# Patient Record
Sex: Male | Born: 1980 | Race: White | Hispanic: No | Marital: Married | Smoking: Former smoker
Health system: Southern US, Community
[De-identification: ages and names within clinical notes are randomized; demographics above are authoritative.]

## PROBLEM LIST (undated history)

## (undated) DIAGNOSIS — I10 Essential (primary) hypertension: Secondary | ICD-10-CM

## (undated) DIAGNOSIS — H9192 Unspecified hearing loss, left ear: Secondary | ICD-10-CM

## (undated) DIAGNOSIS — G43909 Migraine, unspecified, not intractable, without status migrainosus: Secondary | ICD-10-CM

## (undated) DIAGNOSIS — N200 Calculus of kidney: Secondary | ICD-10-CM

## (undated) HISTORY — PX: CLEFT PALATE REPAIR: SUR1165

## (undated) HISTORY — PX: VASECTOMY: SHX75

## (undated) HISTORY — DX: Migraine, unspecified, not intractable, without status migrainosus: G43.909

## (undated) HISTORY — DX: Calculus of kidney: N20.0

## (undated) HISTORY — PX: FRACTURE SURGERY: SHX138

## (undated) HISTORY — DX: Unspecified hearing loss, left ear: H91.92

---
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
## 2006-01-04 ENCOUNTER — Encounter: Admission: RE | Admit: 2006-01-04 | Discharge: 2006-01-04 | Payer: Self-pay | Admitting: Occupational Medicine

## 2008-04-04 ENCOUNTER — Encounter: Admission: RE | Admit: 2008-04-04 | Discharge: 2008-04-04 | Payer: Self-pay | Admitting: Occupational Medicine

## 2008-10-04 ENCOUNTER — Emergency Department: Payer: Self-pay | Admitting: Emergency Medicine

## 2010-08-06 ENCOUNTER — Emergency Department (HOSPITAL_COMMUNITY)
Admission: EM | Admit: 2010-08-06 | Discharge: 2010-08-06 | Disposition: A | Discharge status: Home / Self Care | Payer: 59 | Attending: Emergency Medicine | Admitting: Emergency Medicine

## 2010-08-06 ENCOUNTER — Emergency Department (HOSPITAL_COMMUNITY): Payer: 59

## 2010-08-06 DIAGNOSIS — N201 Calculus of ureter: Secondary | ICD-10-CM | POA: Insufficient documentation

## 2010-08-06 LAB — URINALYSIS, ROUTINE W REFLEX MICROSCOPIC
Bilirubin Urine: NEGATIVE
Leukocytes, UA: NEGATIVE
Nitrite: NEGATIVE
Specific Gravity, Urine: 1.01 (ref 1.005–1.030)
Urobilinogen, UA: 0.2 mg/dL (ref 0.0–1.0)
pH: 7 (ref 5.0–8.0)

## 2010-08-06 LAB — URINE MICROSCOPIC-ADD ON

## 2011-06-10 ENCOUNTER — Telehealth: Payer: Self-pay | Admitting: Family Medicine

## 2011-06-10 NOTE — Telephone Encounter (Signed)
Okay to do.  Thanks!

## 2011-06-10 NOTE — Telephone Encounter (Signed)
Pt's wife is calling to set her husband up to establish with you. She told me that someone called her and told her it would be ok if her and her husband est with you. Her dad is a current patient of yours Mrs. Ronnie Alexander ([phone]) and she said that [name] had discussed this with you. Just want to make sure this is ok.

## 2011-06-26 ENCOUNTER — Encounter: Payer: Self-pay | Admitting: Family Medicine

## 2011-06-26 ENCOUNTER — Ambulatory Visit (INDEPENDENT_AMBULATORY_CARE_PROVIDER_SITE_OTHER): Payer: 59 | Admitting: Family Medicine

## 2011-06-26 DIAGNOSIS — G43109 Migraine with aura, not intractable, without status migrainosus: Secondary | ICD-10-CM

## 2011-06-26 DIAGNOSIS — I8390 Asymptomatic varicose veins of unspecified lower extremity: Secondary | ICD-10-CM

## 2011-06-26 DIAGNOSIS — Q359 Cleft palate, unspecified: Secondary | ICD-10-CM

## 2011-06-26 MED ORDER — CYCLOBENZAPRINE HCL 10 MG PO TABS: 5.0000 mg | ORAL_TABLET | Freq: Three times a day (TID) | ORAL | Status: DC | PRN

## 2011-06-26 NOTE — Progress Notes (Signed)
Migraines, throbbing pain with preceding aura.  Photophobia.  Several a month.  Lasts up to a day.  Unclear FH.  Caffeine and sleep help the HA.  Since early 20s.    Leg pain, taking tylenol or Aspirin.  He has some mild varicosities on the posterior L thigh.  Asking about options.    PMH and SH reviewed  ROS: See HPI, otherwise noncontributory.  Meds, vitals, and allergies reviewed.   GEN: nad, alert and oriented HEENT: mucous membranes moist, L TM with chronic changes, cleft palate repair noted NECK: supple w/o LA CV: rrr.  PULM: ctab, no inc wob ABD: soft, +bs EXT: no edema but  mild varicosities on the posterior L thigh SKIN: no acute rash CN 2-12 wnl B, S/S/DTR wnl x4

## 2011-06-26 NOTE — Patient Instructions (Signed)
You can try an ace wrap on your leg.  See if that helps.  Take flexeril as needed at the beginning of a headache, sedation caution.  Gradually cut back on caffeine.

## 2011-06-28 DIAGNOSIS — I8390 Asymptomatic varicose veins of unspecified lower extremity: Secondary | ICD-10-CM | POA: Insufficient documentation

## 2011-06-28 DIAGNOSIS — Q359 Cleft palate, unspecified: Secondary | ICD-10-CM | POA: Insufficient documentation

## 2011-06-28 DIAGNOSIS — G43109 Migraine with aura, not intractable, without status migrainosus: Secondary | ICD-10-CM | POA: Insufficient documentation

## 2011-06-28 NOTE — Assessment & Plan Note (Signed)
He could use an ACE wrap on this, since they aren't in the calf and a stocking would be cumbersome.  Reassured o/w. F/u prn.

## 2011-06-28 NOTE — Assessment & Plan Note (Signed)
D/w pt about path/phys, triggers, chronic nature.  Taper caffeine, inc exercise and use flexeril with onset of HA, he'll track his frequency. If more frequent, will likely need proph meds.  D/w pt, he understood.  >30 min spent with face to face with patient, >50% counseling and/or coordinating care.

## 2011-12-03 ENCOUNTER — Ambulatory Visit (INDEPENDENT_AMBULATORY_CARE_PROVIDER_SITE_OTHER): Payer: 59 | Admitting: Family Medicine

## 2011-12-03 ENCOUNTER — Encounter: Payer: Self-pay | Admitting: Family Medicine

## 2011-12-03 VITALS — BP 126/90 | HR 84 | Temp 97.8°F | Wt 232.0 lb

## 2011-12-03 DIAGNOSIS — F411 Generalized anxiety disorder: Secondary | ICD-10-CM

## 2011-12-03 DIAGNOSIS — J310 Chronic rhinitis: Secondary | ICD-10-CM

## 2011-12-03 DIAGNOSIS — F419 Anxiety disorder, unspecified: Secondary | ICD-10-CM

## 2011-12-03 MED ORDER — FLUTICASONE PROPIONATE 50 MCG/ACT NA SUSP: 2.0000 | Freq: Every day | NASAL | Status: DC

## 2011-12-03 MED ORDER — AZITHROMYCIN 250 MG PO TABS: ORAL_TABLET | ORAL | Status: DC

## 2011-12-03 NOTE — Patient Instructions (Signed)
Drink plenty of fluids and use the nasal irrigation.  Use flonase and start the antibiotics if not improving.  Talk with your wife about counseling and let me know next week (call the triage line).  Take care.  Let us know if you have other concerns.   Schedule a physical for the spring of 2014

## 2011-12-03 NOTE — Progress Notes (Signed)
Cold sx started about 3-4 days ago.  "I feel like crap."  H/o spring time allergies, mild.  Fall allergies are much worse usually.  No FCV.  HA and sinus pressure.  Congested.    Still with 1-2 migraines in a month.  Still on caffeine.  He stopped smoking.    Anxiety and marital stress. Discussed. No SI/HI.  He has inc stress on the weekend when he has more time at home.  47 month old child. He and his wife don't always agree on parenting styles.  He gets anxious about this.  They have argued, but not been in physical confrontation.  He's asking about options.   He'd like to schedule a physical at some point.   Meds, vitals, and allergies reviewed.   ROS: See HPI.  Otherwise, noncontributory.  GEN: nad, alert and oriented HEENT: mucous membranes moist, tm w/o erythema but chronic changes noted, nasal exam w/o erythema, clear discharge noted,  OP with cobblestoning- nasal and OP with postsurgical changes noted NECK: supple w/o LA CV: rrr.   PULM: ctab, no inc wob EXT: no edema SKIN: no acute rash Affect wnl, speech wnl

## 2011-12-04 DIAGNOSIS — J310 Chronic rhinitis: Secondary | ICD-10-CM | POA: Insufficient documentation

## 2011-12-04 DIAGNOSIS — F419 Anxiety disorder, unspecified: Secondary | ICD-10-CM | POA: Insufficient documentation

## 2011-12-04 NOTE — Assessment & Plan Note (Signed)
With marital stress.  Advised pt to talk to wife about counseling, for himself and also them together.  He'll do this over the weekend and then update me.  This would be a reasonable first step. >25 min spent with face to face with patient, >50% counseling and/or coordinating care.  Okay for outpatient f/u.

## 2011-12-04 NOTE — Assessment & Plan Note (Signed)
Allergic vs viral, possible bacterial source.  Would start flonase, use nasal irrigation and then use zmax if not improved.  He agrees.

## 2011-12-09 ENCOUNTER — Telehealth: Payer: Self-pay | Admitting: Family Medicine

## 2011-12-09 NOTE — Telephone Encounter (Signed)
Caller: Mack/Patient; Patient Name: Ronnie Alexander; PCP: Crawford Givens Clelia Croft) [facility]); Best Callback Phone Number: [phone].  Patient states he was instructed by Dr Para March to call back and leave a message with triage nurse concerning couples counseling.  Patient states "that is a no go."  Patient also reports the Aleda Grana is working well.  Thank you.

## 2011-12-10 MED ORDER — CITALOPRAM HYDROBROMIDE 20 MG PO TABS: 20.0000 mg | ORAL_TABLET | Freq: Every day | ORAL | Status: DC

## 2011-12-10 NOTE — Telephone Encounter (Signed)
Call pt back.  He would have options- either counseling alone, medication treatment, or both together.  If he is interested in the referral for counseling for him, I'll set him up with Dr. Laymond Purser.  If he's interested in medicine, let me know and I'll call him back.  Thanks.

## 2011-12-10 NOTE — Telephone Encounter (Signed)
I called pt.  No SI/HI.  He can't get into counseling due to scheduling with work.  Anxious and irritable but not violent.  We talked about meds.  Would be reasonable to start SSRI with routine cautions, mood/sexual side effects, he'll start and call back in about 1 month with update, sooner prn. He agrees.

## 2011-12-10 NOTE — Telephone Encounter (Signed)
Patient says he thinks he is more interested in the medication route because he is not able to take time off work to get counseling.  I explained that Dr. Laymond Purser does late afternoon appts here at our office but he says he lives in [place] and is not sure that would work out either.  See note below.

## 2012-02-22 ENCOUNTER — Telehealth: Payer: Self-pay | Admitting: Family Medicine

## 2012-02-22 MED ORDER — CITALOPRAM HYDROBROMIDE 20 MG PO TABS: 10.0000 mg | ORAL_TABLET | Freq: Every day | ORAL | Status: DC

## 2012-02-22 NOTE — Telephone Encounter (Signed)
I called pt.  We discussed options.  Could either try celexa at 10mg  a day or change to venlafaxine.  I would try the lower dose of celexa first.  If he does well, then continue.  If he has side effects that are troubling, then he'll notify me and we'll consider venlafaxine.  He agrees.  Call back prn.

## 2012-02-22 NOTE — Telephone Encounter (Signed)
Caller: Ronnie Alexander/Patient; Phone: [phone]; Reason for Call: States that MD asked him to call and give an update after taking Citalopram 20mg  qd for month.  He took it for almost 2 months but had to stop taking due to "inablility to have completion" with intercourse and "it was a bad problem".  He states that it did help with his mood swings though.   This is the first medication of this type that he has tried.   Uses [facility] that we have on file.  Please contact at [phone].

## 2012-04-18 ENCOUNTER — Ambulatory Visit (INDEPENDENT_AMBULATORY_CARE_PROVIDER_SITE_OTHER): Payer: BC Managed Care – PPO | Admitting: Internal Medicine

## 2012-04-18 ENCOUNTER — Encounter: Payer: Self-pay | Admitting: Internal Medicine

## 2012-04-18 VITALS — BP 122/86 | HR 84 | Temp 97.7°F | Ht 72.0 in | Wt 221.0 lb

## 2012-04-18 DIAGNOSIS — N200 Calculus of kidney: Secondary | ICD-10-CM

## 2012-04-18 LAB — POCT URINALYSIS DIPSTICK
Bilirubin, UA: NEGATIVE
Blood, UA: NEGATIVE
Leukocytes, UA: NEGATIVE
Nitrite, UA: NEGATIVE
Protein, UA: NEGATIVE
pH, UA: 6

## 2012-04-18 MED ORDER — HYDROCODONE-ACETAMINOPHEN 10-325 MG PO TABS: 1.0000 | ORAL_TABLET | Freq: Three times a day (TID) | ORAL | Status: DC | PRN

## 2012-04-18 NOTE — Patient Instructions (Signed)

## 2012-04-18 NOTE — Progress Notes (Signed)
Subjective:    Patient ID: Ronnie Alexander, male    DOB: 01-09-1981, 32 y.o.   MRN: [id number]  HPI  Pt presents to the clinic today with c/o bilateral flank pain. He has a history of calcium oxalate stones. He has not noticed any blood in his stool but he has been having the flank pain x 4 days. The pain has not radiated to either of his sides. He denies fever, chill or nausea. He has seen a urologist in the past for similar symptoms. He has had CT scans in the past which show numerous small kidney stones. He usually has not trouble passing except for pain. The pain is 7/10 today, unrelieved by Aleve.  Review of Systems      Past Medical History  Diagnosis Date  . 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 Kidney stones   . Migraines     with aura, throbbing pain, with photo/phonophobia    Current Outpatient Prescriptions  Medication Sig Dispense Refill  . fluticasone (FLONASE) 50 MCG/ACT nasal spray Place 2 sprays into the nose daily.  16 g  6  . citalopram (CELEXA) 20 MG tablet Take 0.5 tablets (10 mg total) by mouth daily.      Marland Kitchen HYDROcodone-acetaminophen (NORCO) 10-325 MG per tablet Take 1 tablet by mouth every 8 (eight) hours as needed for pain.  30 tablet  0   No current facility-administered medications for this visit.    Allergies  Allergen Reactions  . Citalopram     At dose of 20mg  or higher    Family History  Problem Relation Age of Onset  . Alcohol abuse Mother   . Hyperlipidemia Mother   . Depression Mother   . COPD Maternal Grandfather     History   Social History  . Marital Status: Married    Spouse Name: N/A    Number of Children: N/A  . Years of Education: N/A   Occupational History  . Not on file.   Social History Main Topics  . Smoking status: Current Some Day Smoker -- 1.00 packs/day for 14 years    Types: Cigarettes    Last Attempt to Quit: 08/29/2010  . Smokeless tobacco: Never Used  . Alcohol Use: Yes     Comment: rare  . Drug Use:  No  . Sexually Active: Not on file   Other Topics Concern  . Not on file   Social History Narrative   Married 2010   Daughter Gardiner Ramus) born 2013   Works for [employer]     Constitutional: Denies fever, malaise, fatigue, headache or abrupt weight changes.  Gastrointestinal: Denies abdominal pain, bloating, constipation, diarrhea or blood in the stool.  GU: Pt reports bilateral flank pain. Denies urgency, frequency, pain with urination, burning sensation, blood in urine, odor or discharge. .   No other specific complaints in a complete review of systems (except as listed in HPI above).  Objective:   Physical Exam   BP 122/86  Pulse 84  Temp(Src) 97.7 F (36.5 C) (Oral)  Ht 6' (1.829 m)  Wt 221 lb (100.245 kg)  BMI 29.97 kg/m2  SpO2 95% Wt Readings from Last 3 Encounters:  04/18/12 221 lb (100.245 kg)  12/03/11 232 lb (105.235 kg)  06/26/11 221 lb (100.245 kg)    General: Appears his stated age, well developed, well nourished in NAD. Cardiovascular: Normal rate and rhythm. S1,S2 noted.  No murmur, rubs or gallops noted. No  JVD or BLE edema. No carotid bruits noted. Pulmonary/Chest: Normal effort and positive vesicular breath sounds. No respiratory distress. No wheezes, rales or ronchi noted.  Abdomen: Soft and nontender. Normal bowel sounds, no bruits noted. No distention or masses noted. Liver, spleen and kidneys non palpable. +CVA tenderness.       Assessment & Plan:  Kidney stones, new onset with additional workup required:  eRX for Norco today Strain urine with strainer Will obtain CT scan if experiences any urinary retention  RTC as needed or if symptoms persist

## 2012-05-10 ENCOUNTER — Other Ambulatory Visit: Payer: Self-pay | Admitting: *Deleted

## 2012-05-10 DIAGNOSIS — N200 Calculus of kidney: Secondary | ICD-10-CM

## 2012-05-10 MED ORDER — HYDROCODONE-ACETAMINOPHEN 10-325 MG PO TABS: 1.0000 | ORAL_TABLET | Freq: Three times a day (TID) | ORAL | Status: DC | PRN

## 2012-05-10 NOTE — Telephone Encounter (Signed)
Rx phoned to pharmacy.  

## 2012-05-10 NOTE — Telephone Encounter (Signed)
Pt calls for refill of hydrocodone. Was seen a few weeks ago at [facility] for kidney stone prescribed med for pain, still hasn't passed stone yet. I scheduled appt with you tomorrow pm.

## 2012-05-10 NOTE — Telephone Encounter (Signed)
Please call in.  Thanks.  Will see tomorrow.

## 2012-05-11 ENCOUNTER — Encounter: Payer: Self-pay | Admitting: Family Medicine

## 2012-05-11 ENCOUNTER — Ambulatory Visit (INDEPENDENT_AMBULATORY_CARE_PROVIDER_SITE_OTHER): Payer: BC Managed Care – PPO | Admitting: Family Medicine

## 2012-05-11 ENCOUNTER — Ambulatory Visit (INDEPENDENT_AMBULATORY_CARE_PROVIDER_SITE_OTHER)
Admission: RE | Admit: 2012-05-11 | Discharge: 2012-05-11 | Disposition: A | Discharge status: Home / Self Care | Payer: BC Managed Care – PPO | Source: Ambulatory Visit | Attending: Family Medicine | Admitting: Family Medicine

## 2012-05-11 VITALS — BP 138/90 | HR 72 | Temp 97.8°F | Wt 226.0 lb

## 2012-05-11 DIAGNOSIS — M549 Dorsalgia, unspecified: Secondary | ICD-10-CM

## 2012-05-11 DIAGNOSIS — M25579 Pain in unspecified ankle and joints of unspecified foot: Secondary | ICD-10-CM

## 2012-05-11 DIAGNOSIS — N200 Calculus of kidney: Secondary | ICD-10-CM

## 2012-05-11 DIAGNOSIS — M25572 Pain in left ankle and joints of left foot: Secondary | ICD-10-CM

## 2012-05-11 DIAGNOSIS — M25571 Pain in right ankle and joints of right foot: Secondary | ICD-10-CM

## 2012-05-11 LAB — POCT URINALYSIS DIPSTICK
Blood, UA: NEGATIVE
Glucose, UA: NEGATIVE
Nitrite, UA: NEGATIVE
Spec Grav, UA: 1.01
Urobilinogen, UA: NEGATIVE
pH, UA: 6

## 2012-05-11 NOTE — Assessment & Plan Note (Signed)
Refer to uro based on KUB. No sx of urinary retention.  Okay to continue pain meds in meantime.  He'll drop off stones for eval.  Would check ucx in meantime.

## 2012-05-11 NOTE — Progress Notes (Signed)
He was seen ~1 month ago with presumed renal stones.  He has caught stones previously but didn't bring them in.  He had passed them in 07-13-10, hasn't passed any since then.  This feels like similar but hasn't passed a stone yet.  Dull ache on the L>R side of the back.  Constant throbbing, dull ache.  No trauma.  No injury.   Trace blood in urine today.  No fevers.  No vomiting.  Occ pain in the L groin with urination but no pain along the penile shaft.  No gross blood in urine seen by patient with the recent episodes.  The pain medicine helps some, taken at night to sleep.  He has no urinary retention symptoms.    He's had some R ankle pain w/o injury.  He was not having swelling or redness.  No trauma.  More pain with weight bearing.    Meds, vitals, and allergies reviewed.   ROS: See HPI.  Otherwise, noncontributory.  nad ncat Mmm rrr ctab abd soft, not ttp Back w/o CVA pain but lower back is sore on L side R ankle with normal inspection, normal arch Medial and lat mals not ttp ttp inferiorly to the med and lat mal.  Pain with resisted plantarflexion but not ttp with ROM o/w.

## 2012-05-11 NOTE — Assessment & Plan Note (Signed)
Likely tendonitis.  No need to image today.  Would use a lace up brace and f/u prn.  He agrees.

## 2012-05-11 NOTE — Patient Instructions (Addendum)
Go to the lab on the way out.  We'll contact you with your xray report. Get a lace up ankle brace (like you would use for an ankle sprain) and see if that helps.  If not, then notify me.   Take care.

## 2012-05-12 LAB — URINE CULTURE: Organism ID, Bacteria: NO GROWTH

## 2012-05-18 ENCOUNTER — Other Ambulatory Visit: Payer: Self-pay

## 2012-05-18 DIAGNOSIS — N200 Calculus of kidney: Secondary | ICD-10-CM

## 2012-05-18 MED ORDER — HYDROCODONE-ACETAMINOPHEN 10-325 MG PO TABS: 1.0000 | ORAL_TABLET | Freq: Three times a day (TID) | ORAL | Status: DC | PRN

## 2012-05-18 NOTE — Telephone Encounter (Signed)
Please call in

## 2012-05-18 NOTE — Telephone Encounter (Signed)
Pt request refill on hydrocodone apap for kidney stones; pt does not have appt to see urologist until 06/03/12. Pt is working in [employer] today and request sent to [facility].Please advise.

## 2012-05-19 NOTE — Telephone Encounter (Signed)
Rx phoned to pharmacy.  

## 2012-05-19 NOTE — Telephone Encounter (Signed)
Pt called to ck on status of refill; advised called in this morning at 9:08. Pt will ck with [facility] later today to pick up rx.

## 2012-05-20 NOTE — Addendum Note (Signed)
Addended by: Alvina Chou on: 05/20/2012 12:43 PM   Modules accepted: Orders

## 2012-05-24 ENCOUNTER — Ambulatory Visit (INDEPENDENT_AMBULATORY_CARE_PROVIDER_SITE_OTHER): Payer: BC Managed Care – PPO | Admitting: Family Medicine

## 2012-05-24 ENCOUNTER — Telehealth: Payer: Self-pay

## 2012-05-24 ENCOUNTER — Ambulatory Visit (INDEPENDENT_AMBULATORY_CARE_PROVIDER_SITE_OTHER)
Admission: RE | Admit: 2012-05-24 | Discharge: 2012-05-24 | Disposition: A | Discharge status: Home / Self Care | Payer: BC Managed Care – PPO | Source: Ambulatory Visit | Attending: Family Medicine | Admitting: Family Medicine

## 2012-05-24 ENCOUNTER — Encounter: Payer: Self-pay | Admitting: Family Medicine

## 2012-05-24 VITALS — BP 142/88 | HR 88 | Temp 97.6°F | Wt 229.2 lb

## 2012-05-24 DIAGNOSIS — N200 Calculus of kidney: Secondary | ICD-10-CM

## 2012-05-24 LAB — POCT URINALYSIS DIPSTICK
Bilirubin, UA: NEGATIVE
Leukocytes, UA: NEGATIVE
Nitrite, UA: NEGATIVE
Protein, UA: NEGATIVE
pH, UA: 7

## 2012-05-24 MED ORDER — OXYCODONE HCL 5 MG PO TABS: 5.0000 mg | ORAL_TABLET | Freq: Three times a day (TID) | ORAL | Status: DC | PRN

## 2012-05-24 NOTE — Telephone Encounter (Signed)
Pt taking hydrocodone apap 10-325 mg every 8 hrs and is not relieving back pain; pt said pain worse at night while trying to sleep.Pain level now is 8. Pt request stronger pain med. [facility] in [place]. Pt does not have appt with urologist until 06/03/12 at 3:15pm.Please advise.

## 2012-05-24 NOTE — Patient Instructions (Signed)
Take the oxycodone as needed, start with 1 tab at a time. It can make you drowsy.  We'll be in touch about the xray report and the stone labs.  Keep the urology appointment.  Take care.

## 2012-05-24 NOTE — Progress Notes (Signed)
He's been drinking a lot of water.  Stone analysis is in process.  The stone sent for analysis was was passed in 2012.  Known stones on the L side on the last KUB.  He has mainly (90%) L sided back pain.  Pain is worse at night.  No gross blood in urine. No recent passed stones; he's straining his urine.  No fevers.  He's has some L inguinal pain.  A few days ago he thought he was going to pass a stone but he never felt or saw it pass.  No vomiting. No nausea.  No blood in stool, no diarrhea.  Seeing uro on 06/03/12.   Meds, vitals, and allergies reviewed.   ROS: See HPI.  Otherwise, noncontributory.  nad ncat Mmm rrr ctab Abd soft, not ttp Back w/o mid line pain.  He has diffuse L lower sided back pain.  No CVA pain

## 2012-05-24 NOTE — Telephone Encounter (Signed)
Please get him on the schedule today.  Needs to be recheck.

## 2012-05-24 NOTE — Telephone Encounter (Signed)
Scheduled at 4:00 today

## 2012-05-25 LAB — STONE ANALYSIS

## 2012-05-25 NOTE — Assessment & Plan Note (Signed)
Inc to oxycodone for pain, see notes on KUB today.  Trace blood in stool.  I overread the KUB, stone noted.  Stone analysis in progress.  Okay for outpatient f/u. Sedation caution given on meds. He has f/u with uro pending.

## 2012-05-30 ENCOUNTER — Encounter: Payer: Self-pay | Admitting: Family Medicine

## 2012-06-03 ENCOUNTER — Ambulatory Visit (INDEPENDENT_AMBULATORY_CARE_PROVIDER_SITE_OTHER): Payer: BC Managed Care – PPO | Admitting: Urology

## 2012-06-03 ENCOUNTER — Other Ambulatory Visit: Payer: Self-pay | Admitting: Urology

## 2012-06-03 ENCOUNTER — Ambulatory Visit (HOSPITAL_COMMUNITY)
Admission: RE | Admit: 2012-06-03 | Discharge: 2012-06-03 | Disposition: A | Discharge status: Home / Self Care | Payer: BC Managed Care – PPO | Source: Ambulatory Visit | Attending: Urology | Admitting: Urology

## 2012-06-03 ENCOUNTER — Encounter (HOSPITAL_COMMUNITY): Payer: Self-pay

## 2012-06-03 DIAGNOSIS — N200 Calculus of kidney: Secondary | ICD-10-CM

## 2012-06-03 DIAGNOSIS — N201 Calculus of ureter: Secondary | ICD-10-CM

## 2012-06-03 DIAGNOSIS — R1032 Left lower quadrant pain: Secondary | ICD-10-CM | POA: Insufficient documentation

## 2012-06-03 DIAGNOSIS — D3500 Benign neoplasm of unspecified adrenal gland: Secondary | ICD-10-CM | POA: Insufficient documentation

## 2012-06-07 ENCOUNTER — Telehealth: Payer: Self-pay

## 2012-06-07 NOTE — Telephone Encounter (Signed)
Please call pt.    Calcium Oxalate Dihydrate 95% Calcium Oxalate Monohydrate 5%  I thought this had already been sent to him and the uro clinic. Uro should be able to work with him based on the stone results.  Thanks.

## 2012-06-07 NOTE — Telephone Encounter (Signed)
Patient advised.

## 2012-06-07 NOTE — Telephone Encounter (Signed)
Pt left v/m requesting results of kidney stone analysis.Please advise.

## 2012-07-09 IMAGING — CR DG ABDOMEN 1V
1 series · 1 of 1 positions shown · non-contrast
Comparison: None.

CLINICAL DATA: Left flank pain

ABDOMEN - 1 VIEW

[view not recorded]
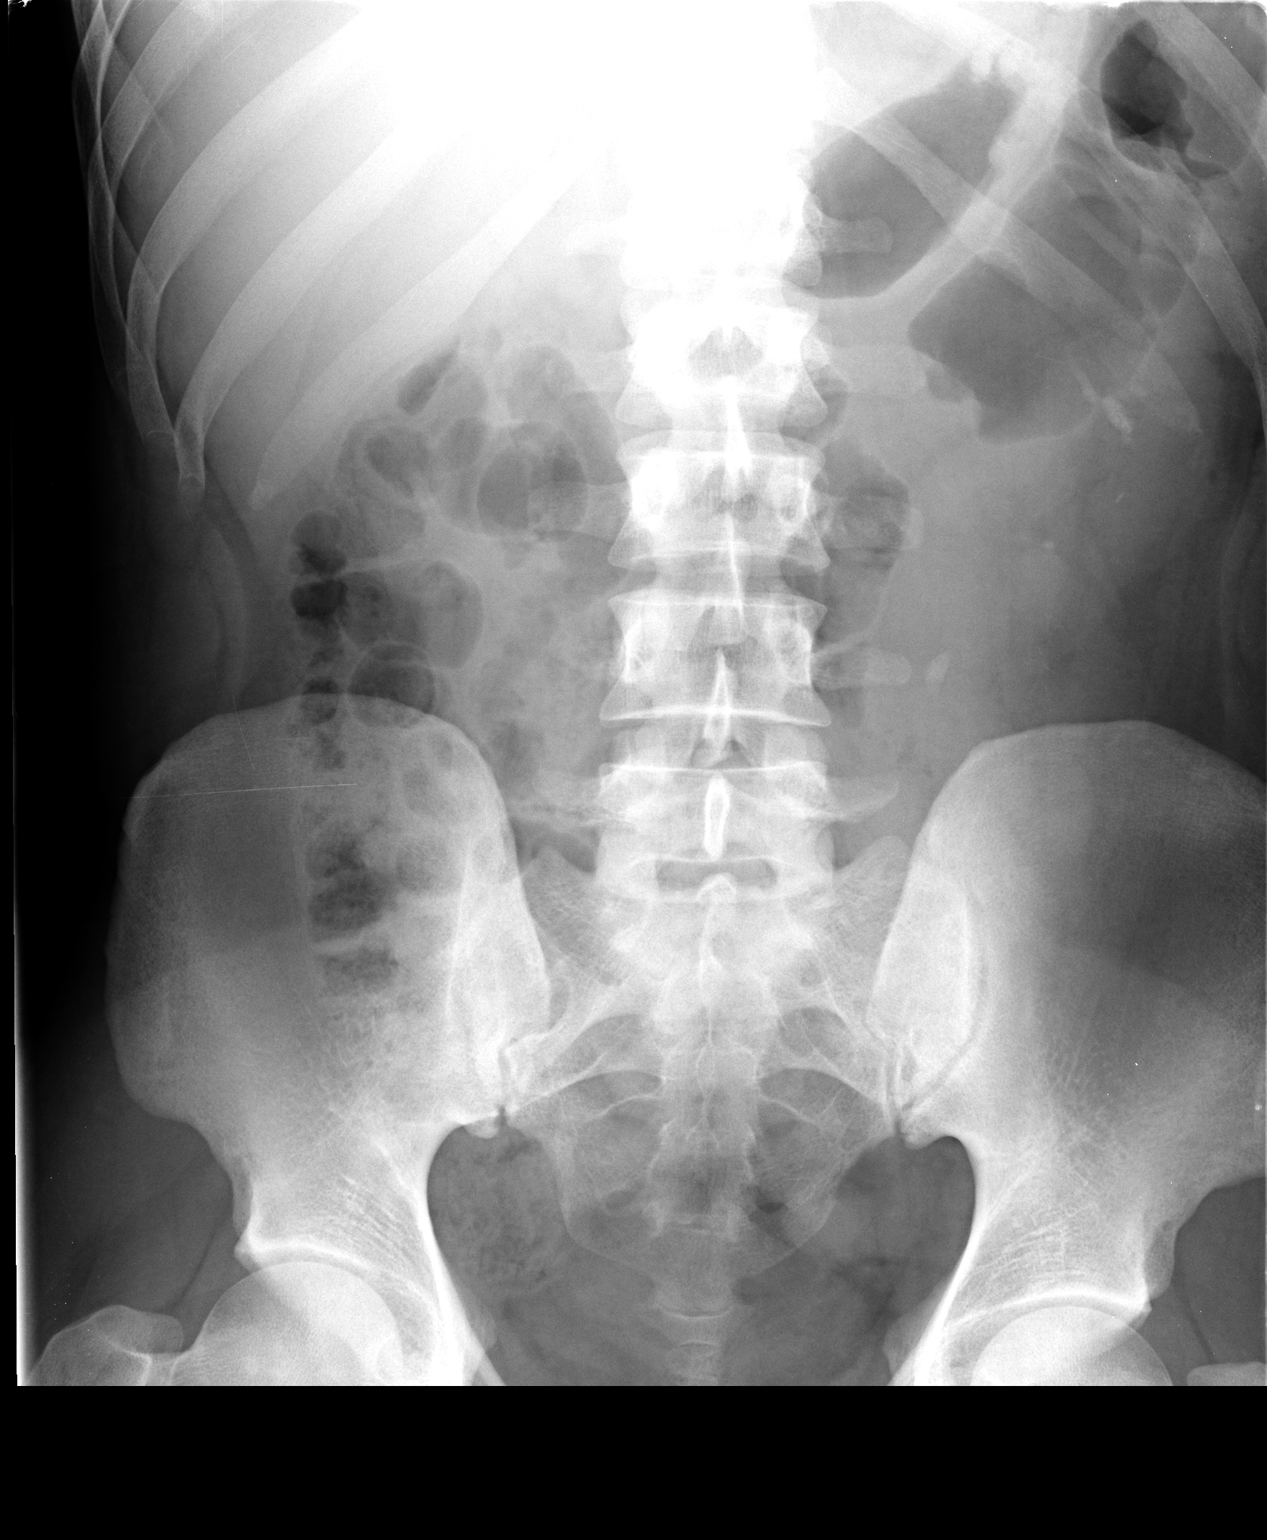

[1 of 1 positions shown; findings below may reference images not displayed]

FINDINGS: There is a 12.4 x 6.7 cm calcification projecting to the
left of the left L4 transverse process. It is shape is suggestive
of a large mid ureteral stone.  It could be a calcified lymph node.
I do not see definite renal calcifications.

No other pathological findings.
IMPRESSION: Probable large left ureteral calculus.  See report.

## 2012-07-11 ENCOUNTER — Other Ambulatory Visit: Payer: Self-pay

## 2012-07-11 NOTE — Telephone Encounter (Signed)
Pt is starting to use Celexa 20 mg taking 1/2 daily again and pt wanted to know if refills were still available at [facility] in [place]. I spoke with [place] at [facility] in [place] and pt does still have refills available. Pt will call back if needed.

## 2012-08-26 ENCOUNTER — Other Ambulatory Visit: Payer: Self-pay | Admitting: Family Medicine

## 2012-08-29 NOTE — Telephone Encounter (Signed)
Electronic refill request.  Please advise. 

## 2012-08-29 NOTE — Telephone Encounter (Signed)
Sent!

## 2014-04-27 IMAGING — CR DG ABDOMEN 1V
2 series · 2 of 2 positions shown · non-contrast
Comparison: 05/11/2012.

CLINICAL DATA: History of left nephrolithiasis.

ABDOMEN - 1 VIEW

[view not recorded (1 of 2)]
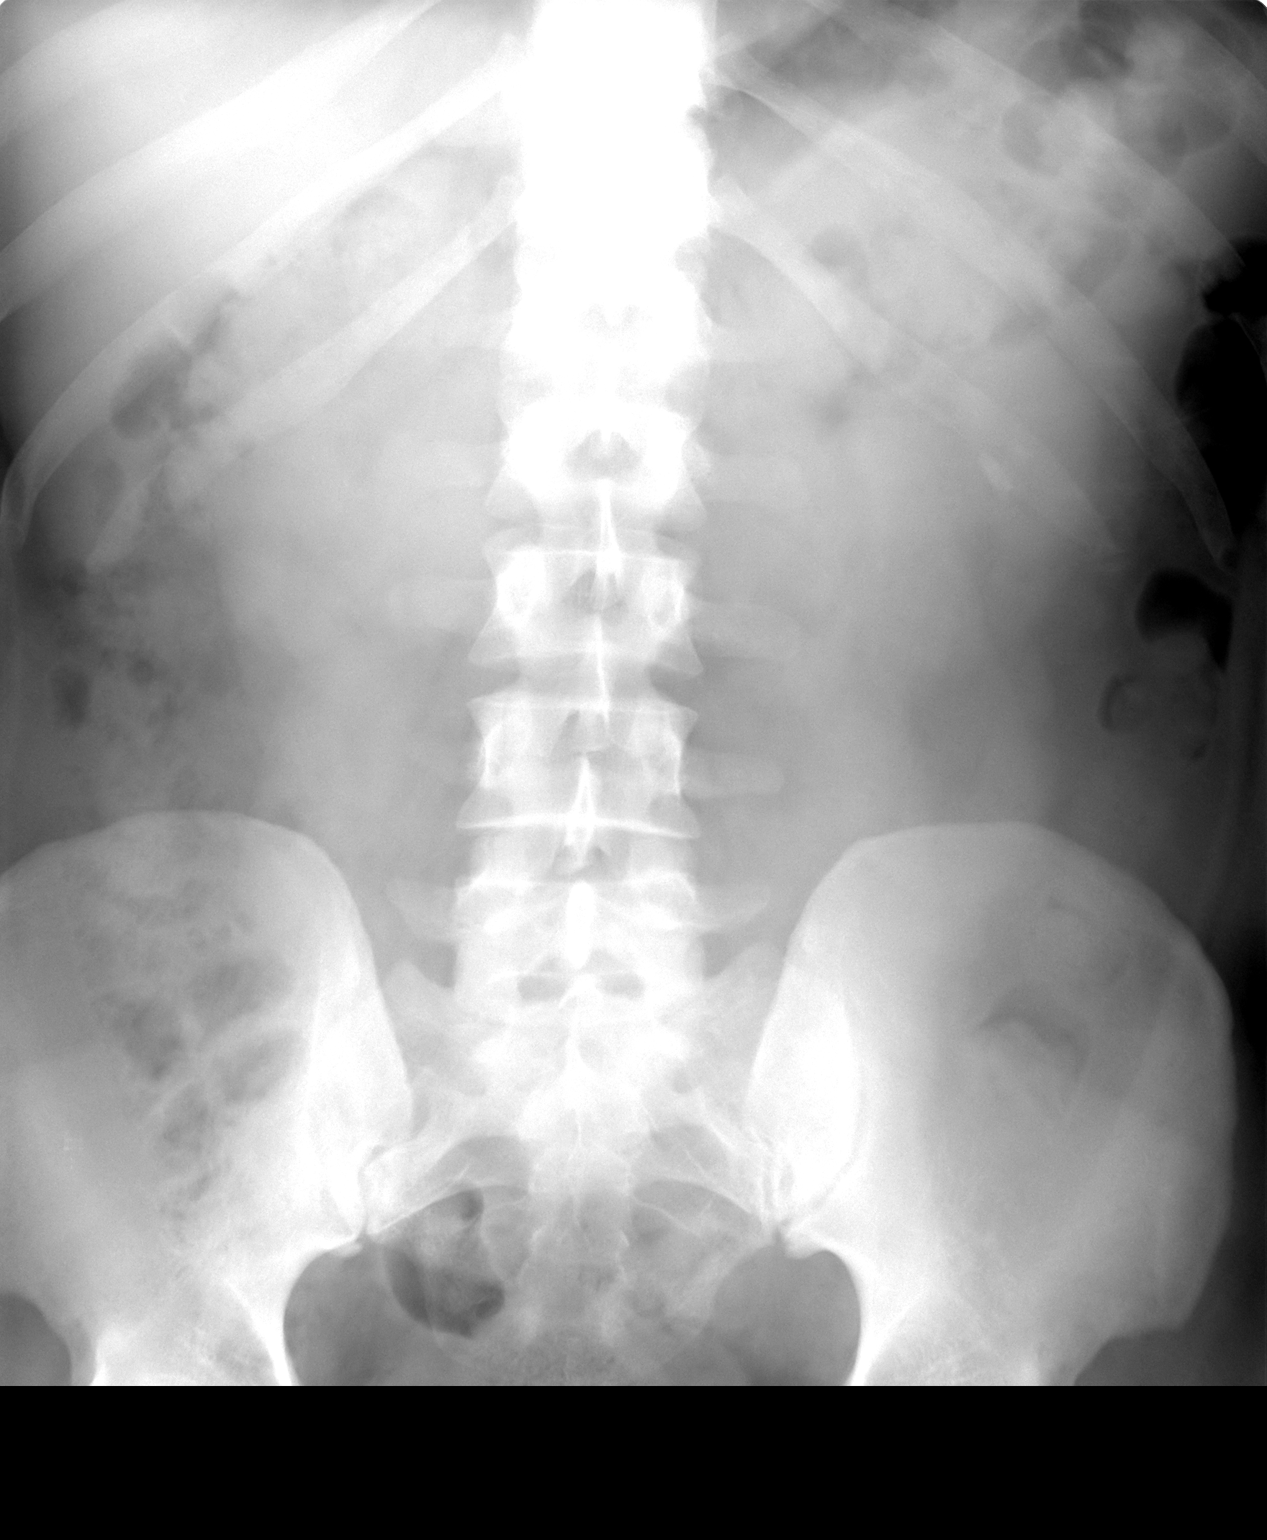

[view not recorded (2 of 2)]
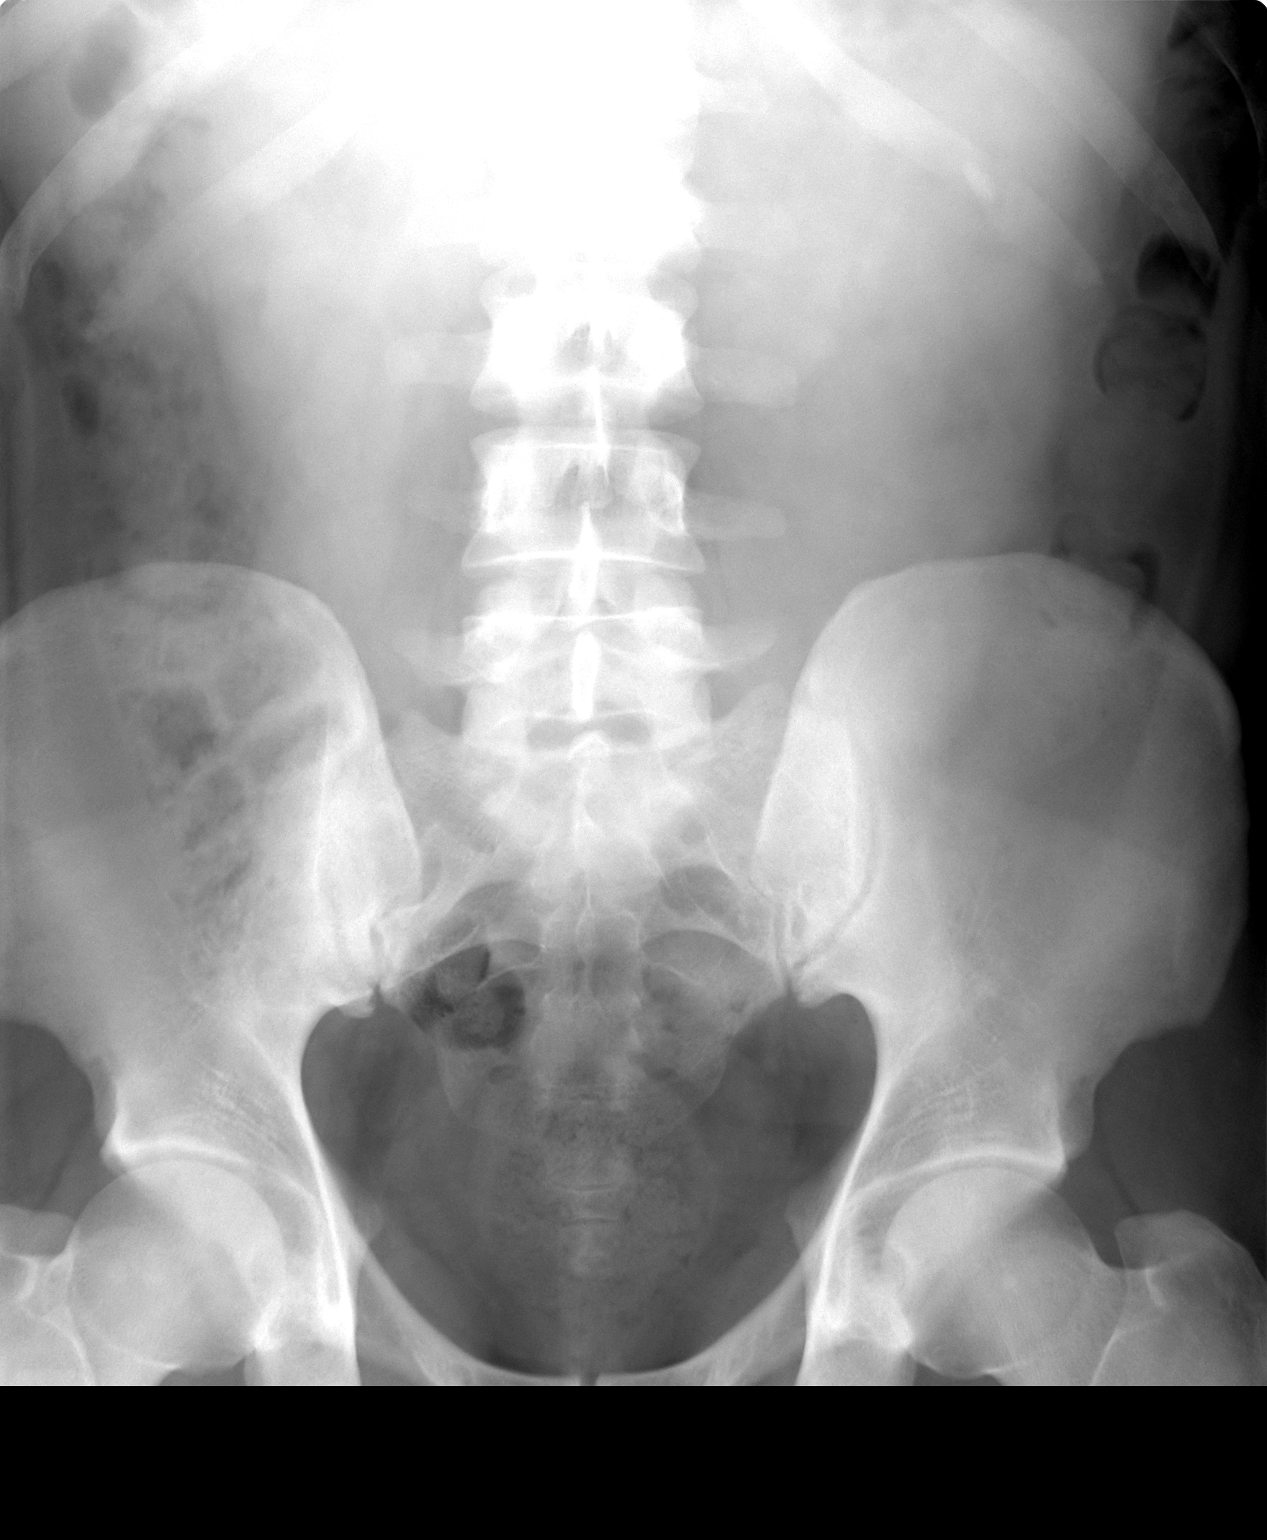

[2 of 2 positions shown; findings below may reference images not displayed]

FINDINGS: Oval calcific density projects over the left twelfth rib
consistent with left renal calculus.  This calculus measures 12 x 5
mm in diameter. Calculus position is not significantly changed from
previous study.  A faint calcific density and seen over the lower
pole of the left renal outline on the previous study is poorly
visualized.  .  No opaque calculi are seen in the path of the
ureters or in the urinary bladder area..  Bowel gas pattern is
normal.  No skeletal lesion is seen.
IMPRESSION: Left nephrolithiasis.

## 2014-05-07 IMAGING — CT CT ABD-PELV W/O CM
2 of 4 series · 17 of 46 positions shown, 19 images · non-contrast
Comparison: KUB 05/24/2012.

CLINICAL DATA: Left flank pain for 6 weeks.

CT ABDOMEN AND PELVIS WITHOUT CONTRAST
TECHNIQUE: Multidetector CT imaging of the abdomen and pelvis was
performed following the standard protocol without intravenous
contrast.

[Series 2: standard/full over (age)lbs 5.0 · axial · 0.80mm/px · z∈[-502,-87]mm · 14 of 91 slices shown, 16 images]
[im 4/91  soft-tissue]
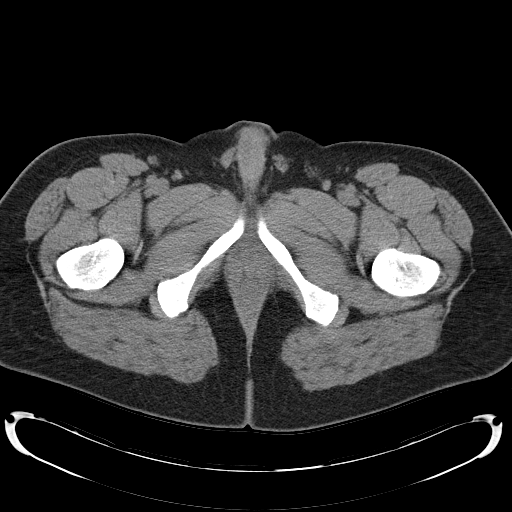
[im 4/91  bone]
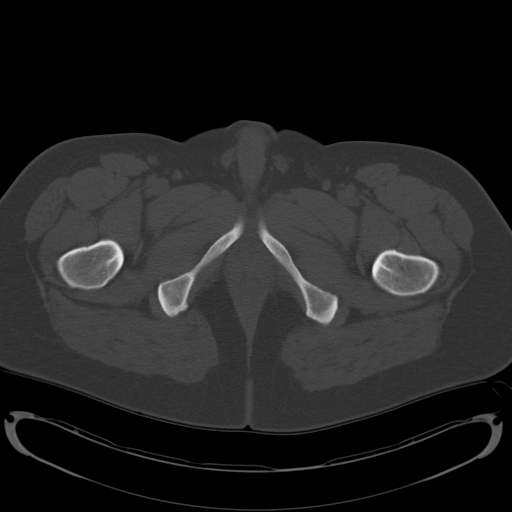
[im 12/91  soft-tissue]
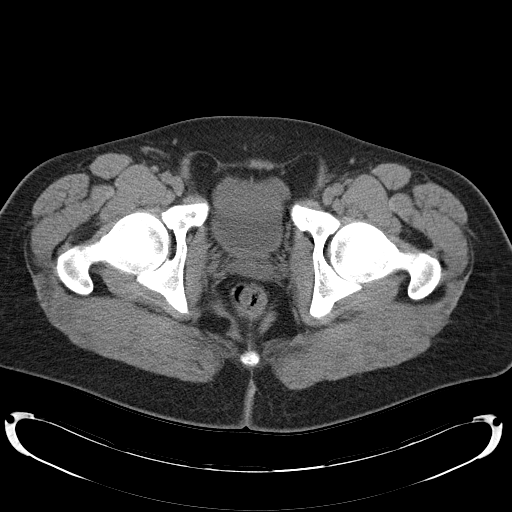
[im 19/91  soft-tissue]
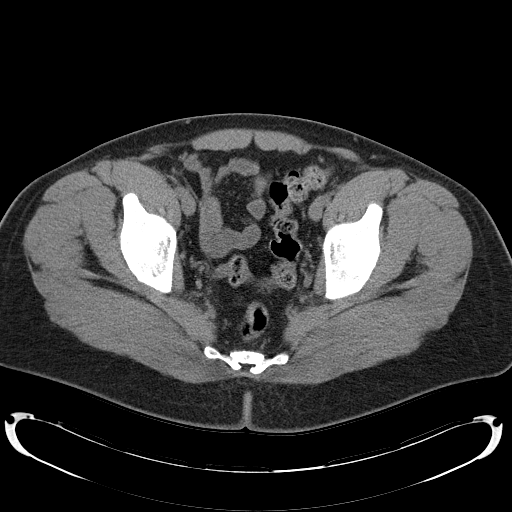
[im 23/91  soft-tissue]
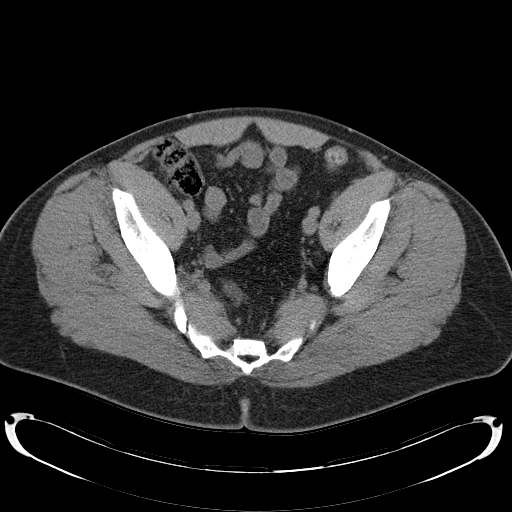
[im 31/91  soft-tissue]
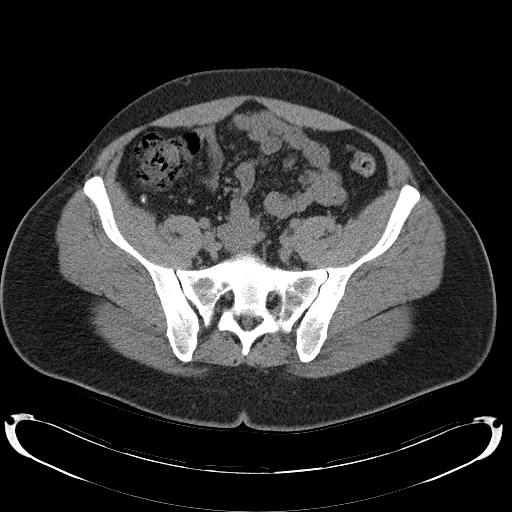
[im 38/91  soft-tissue]
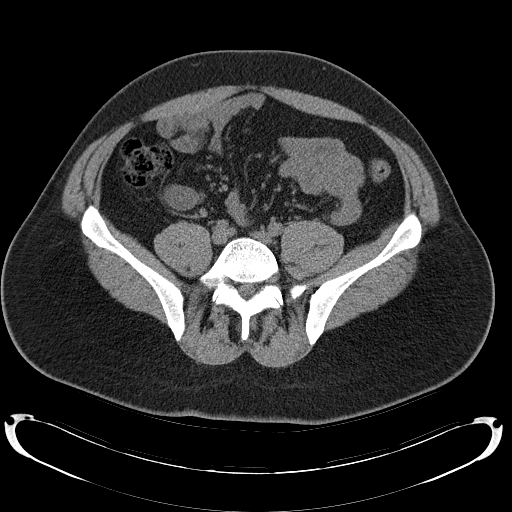
[im 42/91  soft-tissue]
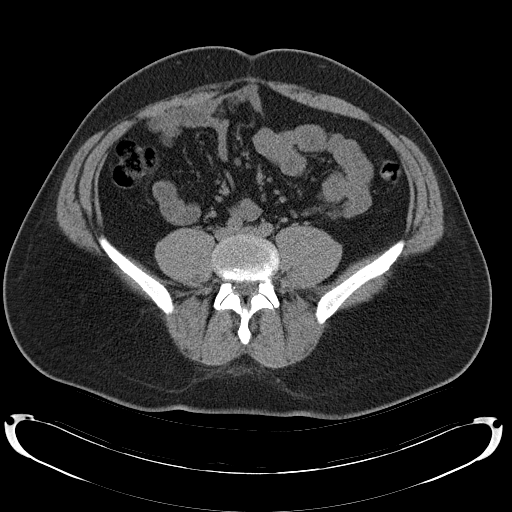
[im 49/91  soft-tissue]
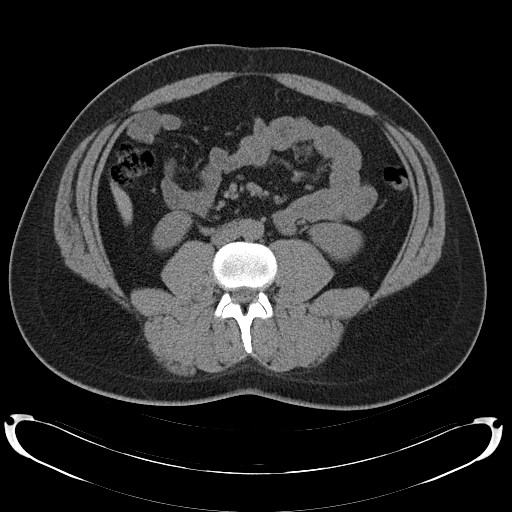
[im 53/91  soft-tissue]
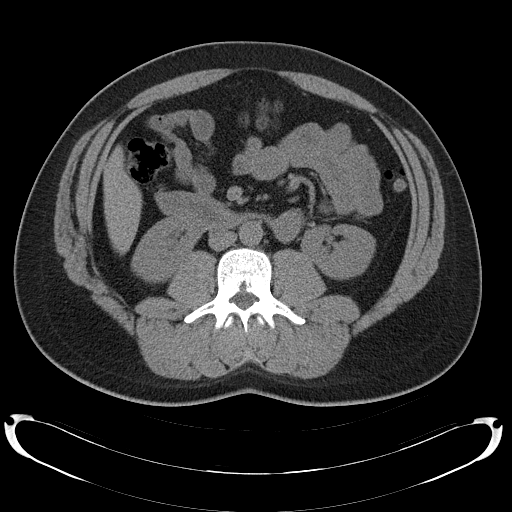
[im 53/91  bone]
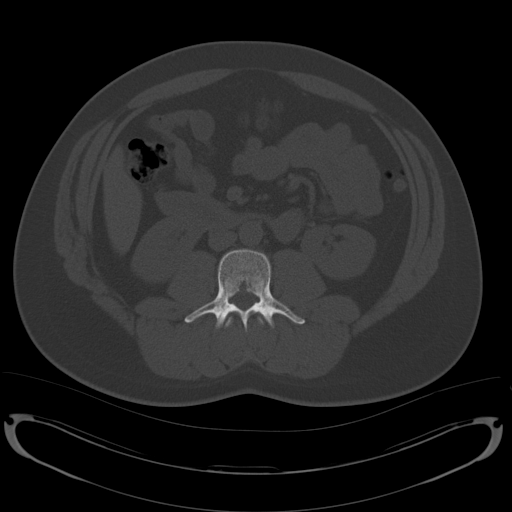
[im 61/91  soft-tissue]
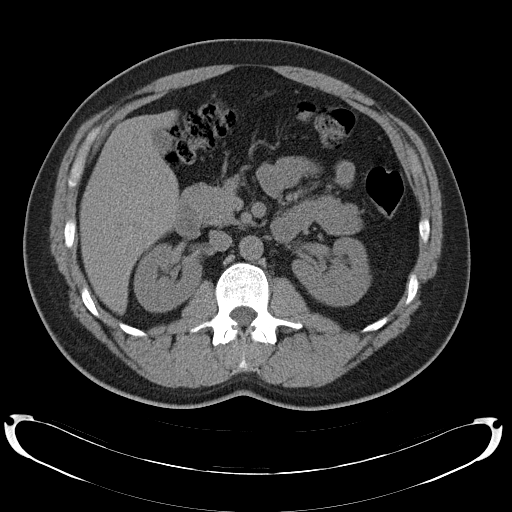
[im 68/91  soft-tissue]
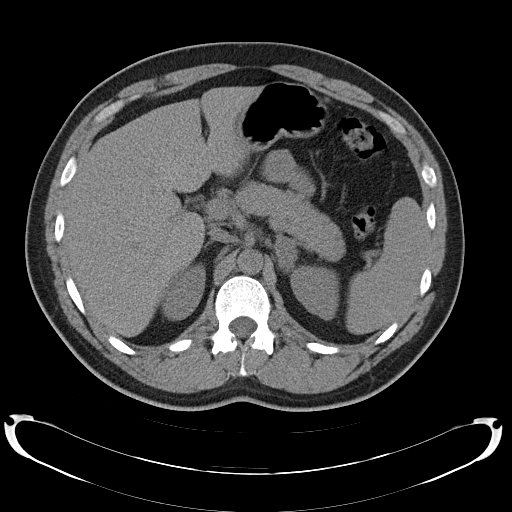
[im 72/91  soft-tissue]
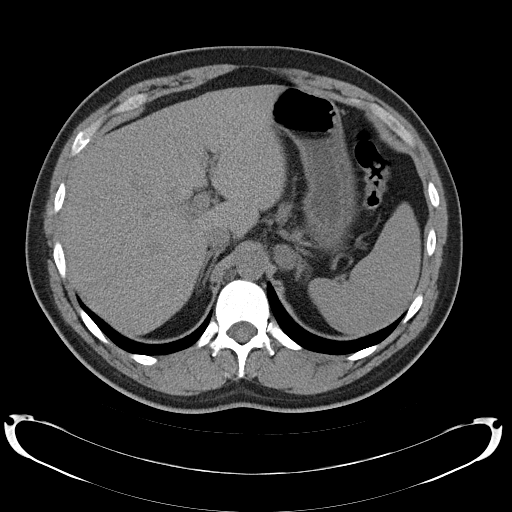
[im 79/91  soft-tissue]
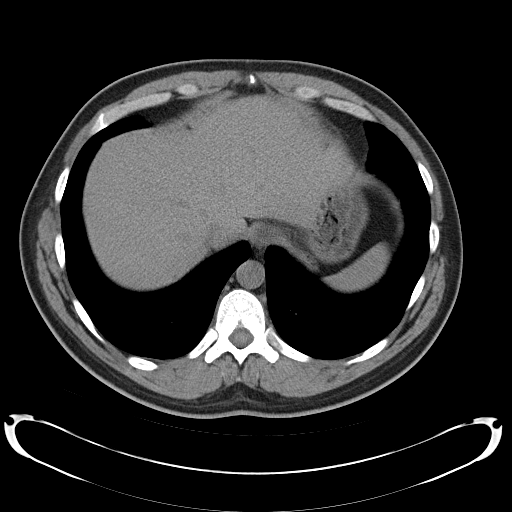
[im 87/91  soft-tissue]
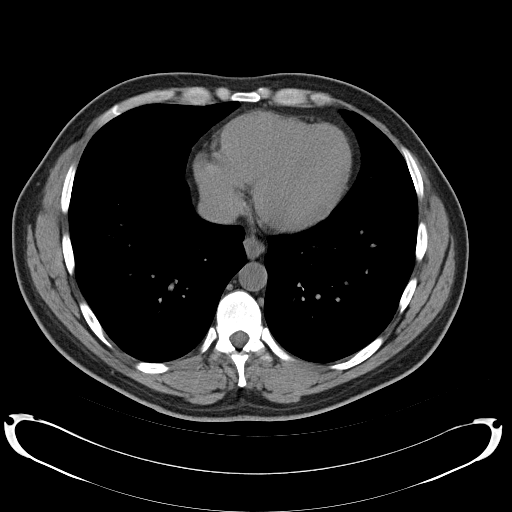

[Series 4: mpr coronal · coronal · 0.75mm/px · 3 of 94 slices shown]
[im 32/94  soft-tissue]
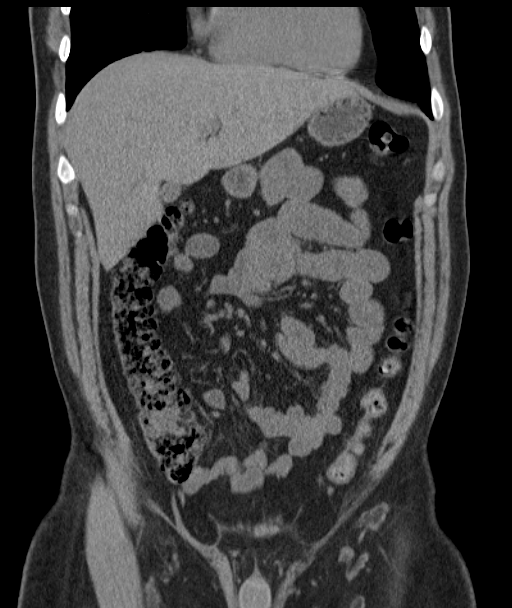
[im 42/94  soft-tissue]
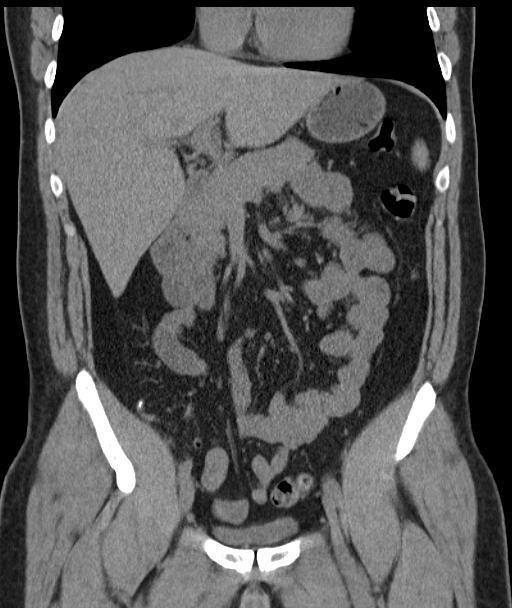
[im 52/94  soft-tissue]
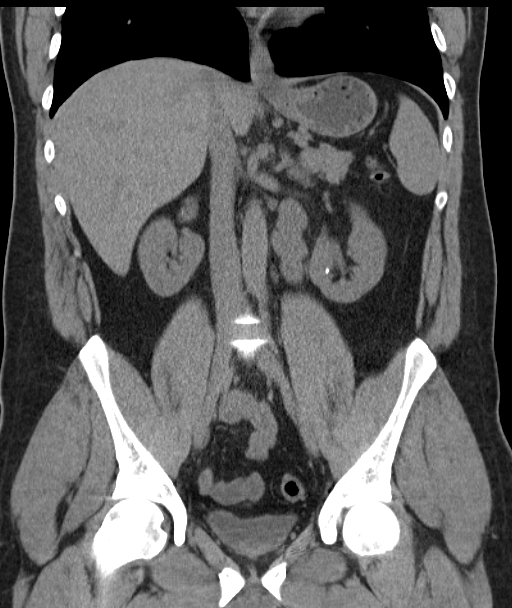

[17 of 46 positions shown; findings below may reference images not displayed]

FINDINGS: Lung bases are clear.  No pleural or pericardial
effusion.

The patient has three nonobstructing stones in the lower pole left
kidney.  The largest stone measures 1.2 cm in diameter.  Second
stone in the lower pole measures 0.5 cm.  The third stone is
punctate.  No right renal stones are present.  There are no
ureteral stones and no hydronephrosis.

The gallbladder, liver, spleen, right adrenal gland and pancreas
appear normal.  There is a fat containing lesion in the left
adrenal gland consistent with a myelolipoma measuring 2.6 x 2.0 cm.
Urinary bladder, prostate gland and seminal vesicles are
unremarkable.  The stomach, small and large bowel and appendix
appear normal.  No focal bony abnormality is identified.
IMPRESSION: 1.  Three nonobstructing stones left kidney.  There is no
hydronephrosis on the right or left and no ureteral stones.
2.  Benign myelolipoma left adrenal gland.

## 2014-09-28 ENCOUNTER — Encounter (INDEPENDENT_AMBULATORY_CARE_PROVIDER_SITE_OTHER): Payer: Self-pay

## 2014-09-28 ENCOUNTER — Encounter: Payer: Self-pay | Admitting: Family Medicine

## 2014-09-28 ENCOUNTER — Ambulatory Visit (INDEPENDENT_AMBULATORY_CARE_PROVIDER_SITE_OTHER): Payer: 59 | Admitting: Family Medicine

## 2014-09-28 VITALS — BP 136/78 | HR 84 | Temp 97.4°F | Wt 216.8 lb

## 2014-09-28 DIAGNOSIS — R030 Elevated blood-pressure reading, without diagnosis of hypertension: Secondary | ICD-10-CM | POA: Diagnosis not present

## 2014-09-28 DIAGNOSIS — L988 Other specified disorders of the skin and subcutaneous tissue: Secondary | ICD-10-CM

## 2014-09-28 DIAGNOSIS — IMO0001 Reserved for inherently not codable concepts without codable children: Secondary | ICD-10-CM

## 2014-09-28 MED ORDER — CYCLOBENZAPRINE HCL 10 MG PO TABS: ORAL_TABLET | ORAL | Status: DC

## 2014-09-28 NOTE — Progress Notes (Signed)
Pre visit review using our clinic review tool, if applicable. No additional management support is needed unless otherwise documented below in the visit note.  Noted some inc BP readings at home.  Noted in the last 6 months, episodic checks.  He attributed it to stress.  His father in law had died and caring for family members with everything going on was/is difficult.  Started a new job earlier this year.  BP check today was fine.  D/w pt.  No CP, SOB, BLE edema.    Lesions on R arm.  Irritated.  89mm macule on the R upper arm.  It was puffier (ie "taller") but not wider.  It isn't irritated now and it is nearly flat, ie back to baseline.    Needed refill on flexeril, used for migraines and for occ muscle strains.  No ADE on med.   Meds, vitals, and allergies reviewed.   ROS: See HPI.  Otherwise, noncontributory.  GEN: nad, alert and oriented HEENT: mucous membranes moist NECK: supple w/o LA CV: rrr. PULM: ctab, no inc wob ABD: soft, +bs EXT: no edema SKIN: no acute rash 20mm macule on the R upper arm, benign appearing.  Uniform.  Not ulcerated.

## 2014-09-28 NOTE — Patient Instructions (Signed)
Take care.  Glad to see you.  Use the flexeril if needed.  Sedation caution.  Your BP was fine.  Notify me if the skin lesion changes.

## 2014-10-02 DIAGNOSIS — I10 Essential (primary) hypertension: Secondary | ICD-10-CM | POA: Insufficient documentation

## 2014-10-02 DIAGNOSIS — L988 Other specified disorders of the skin and subcutaneous tissue: Secondary | ICD-10-CM | POA: Insufficient documentation

## 2014-10-02 NOTE — Assessment & Plan Note (Signed)
Reassured, BP wnl today.

## 2014-10-02 NOTE — Assessment & Plan Note (Signed)
Noted 50mm macule on the R upper arm, benign appearing. Uniform. Not ulcerated.  D/w pt.  He'll monitor.  If changing, then we can biopsy if needed.  He'll update me, d/w pt.  He agrees.

## 2014-12-07 ENCOUNTER — Encounter: Payer: Self-pay | Admitting: Family Medicine

## 2015-01-25 ENCOUNTER — Ambulatory Visit (INDEPENDENT_AMBULATORY_CARE_PROVIDER_SITE_OTHER): Payer: 59 | Admitting: Family Medicine

## 2015-01-25 ENCOUNTER — Encounter: Payer: Self-pay | Admitting: Family Medicine

## 2015-01-25 VITALS — BP 146/92 | HR 91 | Temp 98.6°F | Wt 220.0 lb

## 2015-01-25 DIAGNOSIS — M791 Myalgia, unspecified site: Secondary | ICD-10-CM

## 2015-01-25 NOTE — Patient Instructions (Signed)
Likely a back strain.  Heat, ibuprofen with food, flexeril.  Either small hernias in your groin or a groin strain.  If you have more pain or a visible lump, then we can set you up with general surgery for consideration of repair.  Take care.  Glad to see you.

## 2015-01-25 NOTE — Progress Notes (Signed)
Pre visit review using our clinic review tool, if applicable. No additional management support is needed unless otherwise documented below in the visit note.  He noted skin was sensitive on the abd and lower back for the last 10 days.  No trauma.  He didn't know if he had a recurrent pilonidal cyst or a hernia. No FCNAVD.  No draining lesion.   With some B groin pain.  No noted masses.    Meds, vitals, and allergies reviewed.   ROS: See HPI.  Otherwise, noncontributory.  nad ncat rrr ctab Back not ttp in midline L lower back slightly ttp with muscle tightness noted.  abd soft Testes bilaterally descended without nodularity, tenderness or masses but I think I can feel small B inguinal hernias. No scrotal masses or lesions o/w. No penis lesions or urethral discharge.

## 2015-01-27 DIAGNOSIS — M791 Myalgia, unspecified site: Secondary | ICD-10-CM | POA: Insufficient documentation

## 2015-01-27 NOTE — Assessment & Plan Note (Signed)
Likely a benign back strain. Heat, ibuprofen with food, flexeril.  D/w pt.  He agrees.  Either small hernias vs a groin strain. If more pain or a visible lump, then we can set him up with general surgery for consideration of repair.  He agrees.

## 2015-03-29 ENCOUNTER — Encounter: Payer: Self-pay | Admitting: Family Medicine

## 2015-03-29 ENCOUNTER — Ambulatory Visit (INDEPENDENT_AMBULATORY_CARE_PROVIDER_SITE_OTHER): Payer: 59 | Admitting: Family Medicine

## 2015-03-29 VITALS — BP 132/100 | HR 76 | Temp 97.8°F | Wt 218.2 lb

## 2015-03-29 DIAGNOSIS — J329 Chronic sinusitis, unspecified: Secondary | ICD-10-CM

## 2015-03-29 DIAGNOSIS — J019 Acute sinusitis, unspecified: Secondary | ICD-10-CM | POA: Insufficient documentation

## 2015-03-29 DIAGNOSIS — J0100 Acute maxillary sinusitis, unspecified: Secondary | ICD-10-CM

## 2015-03-29 MED ORDER — AMOXICILLIN-POT CLAVULANATE 875-125 MG PO TABS: 1.0000 | ORAL_TABLET | Freq: Two times a day (BID) | ORAL | Status: DC

## 2015-03-29 NOTE — Assessment & Plan Note (Signed)
New problem. Likely viral in origin; however, patient is at increased risk for bacterial infection given history. Advise continue use of nasal saline and OTC Dayquil. Antibiotic to be filled if he fails to improve.  Rx for Augmentin sent.

## 2015-03-29 NOTE — Progress Notes (Signed)
   Subjective:  Patient ID: Ronnie Alexander, male    DOB: [birth year]  Age: 35 y.o. MRN: [id number]  CC: ? Sinus infection  HPI:  35 year old male with a history of cleft palate presents with concerns that he may have a sinus pressure.  Patient states that for the past 3 days she's had nasal congestion. He states that it is green/purulent when he blows his nose. He reports significant sinus pain. He's been using saline and DayQuil with no relief. No associated fevers or chills. No known exacerbating factors. Of note, he has a long history of sinus infections as well as otitis media given history of cleft palate.  Social Hx   Social History   Social History  . Marital Status: Married    Spouse Name: N/A  . Number of Children: N/A  . Years of Education: N/A   Social History Main Topics  . Smoking status: Former Smoker -- 1.00 packs/day for 14 years    Types: Cigarettes    Quit date: 08/29/2010  . Smokeless tobacco: Never Used  . Alcohol Use: 0.0 oz/week    0 Standard drinks or equivalent per week     Comment: rare  . Drug Use: No  . Sexual Activity: Not Asked   Other Topics Concern  . None   Social History Narrative   Married 2010   Daughter Judeth Porch) born 2013   Works for [employer]: Negative.   HENT: Positive for congestion and sinus pressure.    Objective:  BP 132/100 mmHg  Pulse 76  Temp(Src) 97.8 F (36.6 C) (Oral)  Wt 218 lb 4 oz (98.998 kg)  SpO2 97%  BP/Weight 03/29/2015 [id number]  Systolic BP [id number]  Diastolic BP 123XX123 92 78  Wt. (Lbs) 218.25 220 216.75  BMI 29.59 29.83 29.39   Physical Exam  Constitutional: He appears well-developed. No distress.  HENT:  Head: Normocephalic and atraumatic.  Oropharynx with mild to moderate erythema. No exudate. Left TM with a perforation from prior tympanostomy tube. Right TM with scarring but otherwise normal. Significant maxillary sinus tenderness to  palpation.  Cardiovascular: Normal rate and regular rhythm.   Pulmonary/Chest: Effort normal and breath sounds normal.  Neurological: He is alert.  Psychiatric: He has a normal mood and affect.  Vitals reviewed.  Assessment & Plan:   Problem List Items Addressed This Visit    Sinusitis - Primary    New problem. Likely viral in origin; however, patient is at increased risk for bacterial infection given history. Advise continue use of nasal saline and OTC Dayquil. Antibiotic to be filled if he fails to improve.  Rx for Augmentin sent.      Relevant Medications   amoxicillin-clavulanate (AUGMENTIN) 875-125 MG tablet      Meds ordered this encounter  Medications  . amoxicillin-clavulanate (AUGMENTIN) 875-125 MG tablet    Sig: Take 1 tablet by mouth 2 (two) times daily.    Dispense:  20 tablet    Refill:  0    Follow-up: PRN  [place]

## 2015-03-29 NOTE — Patient Instructions (Signed)
This is likely viral.  However, given your history you are at higher risk of complications (bacterial infection).  Use the antibiotic as prescribed.  Follow up as needed.  Take care  Dr. Lacinda Axon

## 2015-03-29 NOTE — Progress Notes (Signed)
Pre visit review using our clinic review tool, if applicable. No additional management support is needed unless otherwise documented below in the visit note. 

## 2015-08-19 ENCOUNTER — Ambulatory Visit (INDEPENDENT_AMBULATORY_CARE_PROVIDER_SITE_OTHER): Payer: 59 | Admitting: Family Medicine

## 2015-08-19 ENCOUNTER — Encounter: Payer: Self-pay | Admitting: Family Medicine

## 2015-08-19 VITALS — BP 142/92 | HR 83 | Temp 98.0°F | Wt 216.5 lb

## 2015-08-19 DIAGNOSIS — R031 Nonspecific low blood-pressure reading: Secondary | ICD-10-CM

## 2015-08-19 DIAGNOSIS — IMO0001 Reserved for inherently not codable concepts without codable children: Secondary | ICD-10-CM

## 2015-08-19 DIAGNOSIS — R030 Elevated blood-pressure reading, without diagnosis of hypertension: Secondary | ICD-10-CM

## 2015-08-19 DIAGNOSIS — IMO0002 Reserved for concepts with insufficient information to code with codable children: Secondary | ICD-10-CM

## 2015-08-19 LAB — BASIC METABOLIC PANEL
BUN: 11 mg/dL (ref 6–23)
CALCIUM: 9.2 mg/dL (ref 8.4–10.5)
CO2: 29 meq/L (ref 19–32)
Chloride: 106 mEq/L (ref 96–112)
Creatinine, Ser: 0.88 mg/dL (ref 0.40–1.50)
GFR: 104.44 mL/min (ref >60.00)
GLUCOSE: 90 mg/dL (ref 70–99)
POTASSIUM: 4.3 meq/L (ref 3.5–5.1)
SODIUM: 140 meq/L (ref 135–145)

## 2015-08-19 MED ORDER — CYCLOBENZAPRINE HCL 10 MG PO TABS: ORAL_TABLET | ORAL | Status: DC

## 2015-08-19 NOTE — Progress Notes (Signed)
Pre visit review using our clinic review tool, if applicable. No additional management support is needed unless otherwise documented below in the visit note.  Elevated BP.  Drinking more water and less soda. BP was elevated on outside checks.  He is cutting back on red meat and fried food in the meantime. He has cut out salt.  Weight is down, intentionally.  He had checked BP out of clinic.  Had been 140s/90s, occ slightly higher.   No CP, SOB, BLE edema.    Meds, vitals, and allergies reviewed.   ROS: Per HPI unless specifically indicated in ROS section   GEN: nad, alert and oriented HEENT: mucous membranes moist NECK: supple w/o LA CV: rrr PULM: ctab, no inc wob ABD: soft, +bs EXT: no edema SKIN: no acute rash

## 2015-08-19 NOTE — Patient Instructions (Signed)
Go to the lab on the way out.  We'll contact you with your lab report. Keep working on your weight and update me in about 2-3 weeks.  Take care.  Glad to see you.

## 2015-08-19 NOTE — Assessment & Plan Note (Signed)
Continue DASH interventions. Check labs today.  He'll check BP at home and update me in about 2-3 weeks.  Okay for outpatient f/u.  He may be able to get BP down to goal w/o meds in the near future.  D/w pt.

## 2017-05-07 ENCOUNTER — Encounter (HOSPITAL_COMMUNITY): Payer: Self-pay | Admitting: Emergency Medicine

## 2017-05-07 ENCOUNTER — Ambulatory Visit (HOSPITAL_COMMUNITY)
Admission: EM | Admit: 2017-05-07 | Discharge: 2017-05-07 | Disposition: A | Discharge status: Home / Self Care | Payer: 59 | Attending: Emergency Medicine | Admitting: Emergency Medicine

## 2017-05-07 ENCOUNTER — Other Ambulatory Visit: Payer: Self-pay

## 2017-05-07 DIAGNOSIS — Z87442 Personal history of urinary calculi: Secondary | ICD-10-CM | POA: Diagnosis not present

## 2017-05-07 DIAGNOSIS — Z87891 Personal history of nicotine dependence: Secondary | ICD-10-CM | POA: Insufficient documentation

## 2017-05-07 DIAGNOSIS — H9192 Unspecified hearing loss, left ear: Secondary | ICD-10-CM | POA: Diagnosis not present

## 2017-05-07 DIAGNOSIS — R05 Cough: Secondary | ICD-10-CM | POA: Insufficient documentation

## 2017-05-07 DIAGNOSIS — J029 Acute pharyngitis, unspecified: Secondary | ICD-10-CM | POA: Insufficient documentation

## 2017-05-07 DIAGNOSIS — R51 Headache: Secondary | ICD-10-CM | POA: Insufficient documentation

## 2017-05-07 DIAGNOSIS — K219 Gastro-esophageal reflux disease without esophagitis: Secondary | ICD-10-CM | POA: Insufficient documentation

## 2017-05-07 LAB — POCT RAPID STREP A: STREPTOCOCCUS, GROUP A SCREEN (DIRECT): NEGATIVE

## 2017-05-07 LAB — POCT INFECTIOUS MONO SCREEN: MONO SCREEN: NEGATIVE

## 2017-05-07 MED ORDER — IBUPROFEN 800 MG PO TABS
800.0000 mg | ORAL_TABLET | Freq: Once | ORAL | Status: AC
  Administered 2017-05-07: 800 mg via ORAL

## 2017-05-07 MED ORDER — AMOXICILLIN-POT CLAVULANATE 875-125 MG PO TABS: 1.0000 | ORAL_TABLET | Freq: Two times a day (BID) | ORAL | 0 refills | Status: DC

## 2017-05-07 MED ORDER — ACETAMINOPHEN 500 MG PO TABS: 1000.0000 mg | ORAL_TABLET | Freq: Once | ORAL | Status: DC

## 2017-05-07 MED ORDER — LIDOCAINE HCL (PF) 1 % IJ SOLN
INTRAMUSCULAR | Status: AC
  Filled 2017-05-07: qty 30

## 2017-05-07 MED ORDER — CEFTRIAXONE SODIUM 1 G IJ SOLR
INTRAMUSCULAR | Status: AC
  Filled 2017-05-07: qty 10

## 2017-05-07 MED ORDER — ACETAMINOPHEN 325 MG PO TABS
ORAL_TABLET | ORAL | Status: AC
  Filled 2017-05-07: qty 3

## 2017-05-07 MED ORDER — IBUPROFEN 800 MG PO TABS
ORAL_TABLET | ORAL | Status: AC
  Filled 2017-05-07: qty 1

## 2017-05-07 MED ORDER — IBUPROFEN 600 MG PO TABS: 600.0000 mg | ORAL_TABLET | Freq: Four times a day (QID) | ORAL | 0 refills | Status: DC | PRN

## 2017-05-07 MED ORDER — CEFTRIAXONE SODIUM 1 G IJ SOLR
1.0000 g | Freq: Once | INTRAMUSCULAR | Status: AC
  Administered 2017-05-07: 1 g via INTRAMUSCULAR

## 2017-05-07 MED ORDER — HYDROCODONE-ACETAMINOPHEN 5-325 MG PO TABS: 1.0000 | ORAL_TABLET | Freq: Four times a day (QID) | ORAL | 0 refills | Status: DC | PRN

## 2017-05-07 MED ORDER — DEXAMETHASONE SODIUM PHOSPHATE 10 MG/ML IJ SOLN
10.0000 mg | Freq: Once | INTRAMUSCULAR | Status: AC
  Administered 2017-05-07: 10 mg via INTRAMUSCULAR

## 2017-05-07 MED ORDER — DEXAMETHASONE SODIUM PHOSPHATE 10 MG/ML IJ SOLN
INTRAMUSCULAR | Status: AC
  Filled 2017-05-07: qty 1

## 2017-05-07 MED ORDER — ACETAMINOPHEN 325 MG PO TABS
975.0000 mg | ORAL_TABLET | Freq: Once | ORAL | Status: AC
  Administered 2017-05-07: 975 mg via ORAL

## 2017-05-07 NOTE — ED Triage Notes (Signed)
Pt reports a sore throat x1 week.  Pt states he is hardly able to eat.  He does not know if he has had a fever.

## 2017-05-07 NOTE — ED Provider Notes (Signed)
HPI  SUBJECTIVE:  Patient reports sore throat starting 1 week ago. Sx worse with swallowing, coughing, sneezing.  Symptoms are better with DayQuil. No fevers    + Cough/URI sxs wih nasal congestion postnasal drip No Myalgias + Headache No Rash Reports Significantly decreased p.o. intake secondary to the pain.     No Recent Strep or mono Exposure No Abdominal Pain No reflux sxs No Allergy sxs  No Breathing difficulty, voice changes No Drooling No Trismus No abx in past month.  No antipyretic in past 6-8 hrs He has a past medical history of cleft lip palate repair, GERD for which she takes Zantac.  No history of diabetes, hypertension.  [id number], Elveria Rising, MD    Past Medical History:  Diagnosis Date  . 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 Kidney stones   . Migraines    with aura, throbbing pain, with photo/phonophobia    Past Surgical History:  Procedure Laterality Date  . CLEFT PALATE REPAIR     17 total procedures  . FRACTURE SURGERY     L tibia facture    Family History  Problem Relation Age of Onset  . Alcohol abuse Mother   . Hyperlipidemia Mother   . Depression Mother   . COPD Maternal Grandfather     Social History   Tobacco Use  . Smoking status: Former Smoker    Packs/day: 1.00    Years: 14.00    Pack years: 14.00    Types: Cigarettes    Last attempt to quit: 08/29/2010    Years since quitting: 6.6  . Smokeless tobacco: Never Used  Substance Use Topics  . Alcohol use: Yes    Alcohol/week: 0.0 oz    Comment: rare  . Drug use: No     Current Facility-Administered Medications:  .  cefTRIAXone (ROCEPHIN) injection 1 g, 1 g, Intramuscular, Once, Melynda Ripple, MD  Current Outpatient Medications:  .  amoxicillin-clavulanate (AUGMENTIN) 875-125 MG tablet, Take 1 tablet by mouth 2 (two) times daily., Disp: 20 tablet, Rfl: 0 .  HYDROcodone-acetaminophen (NORCO/VICODIN) 5-325 MG tablet, Take 1-2 tablets by mouth every 6 (six) hours as  needed for moderate pain., Disp: 20 tablet, Rfl: 0 .  ibuprofen (ADVIL,MOTRIN) 600 MG tablet, Take 1 tablet (600 mg total) by mouth every 6 (six) hours as needed., Disp: 30 tablet, Rfl: 0  Allergies  Allergen Reactions  . Citalopram     At dose of 20mg  or higher     ROS  As noted in HPI.   Physical Exam  BP (!) 151/106 (BP Location: Left Arm)   Pulse 89   Temp 98.6 F (37 C) (Oral)   SpO2 98%    Constitutional: Well developed, well nourished, no acute distress Eyes:  EOMI, conjunctiva normal bilaterally HENT: Normocephalic, atraumatic,mucus mild membranes moist. +  nasal congestion +  erythematous oropharynx + significantly erythematous enlarged tonsils on the left more than right  - exudates. Uvula midline.  Respiratory: Normal inspiratory effort Cardiovascular: Normal rate, no murmurs, rubs, gallops GI: nondistended, nontender. No appreciable splenomegaly skin: No rash, skin intact Lymph: + cervical LN  Musculoskeletal: no deformities Neurologic: Alert & oriented x 3, no focal neuro deficits Psychiatric: Speech and behavior appropriate.  ED Course   Medications  cefTRIAXone (ROCEPHIN) injection 1 g (not administered)  dexamethasone (DECADRON) injection 10 mg (10 mg Intramuscular Given 05/07/17 1347)  ibuprofen (ADVIL,MOTRIN) tablet 800 mg (800 mg Oral Given 05/07/17 1346)  acetaminophen (TYLENOL) tablet 975  mg (975 mg Oral Given 05/07/17 1347)    Orders Placed This Encounter  Procedures  . Culture, group A strep    Standing Status:   Standing    Number of Occurrences:   1  . POCT rapid strep A [facility])    Standing Status:   Standing    Number of Occurrences:   1  . Infectious mono screen, POC    Standing Status:   Standing    Number of Occurrences:   1    Results for orders placed or performed during the hospital encounter of 05/07/17 (from the past 24 hour(s))  POCT rapid strep A [facility])     Status: None   Collection Time: 05/07/17  1:27 PM   Result Value Ref Range   Streptococcus, Group A Screen (Direct) NEGATIVE NEGATIVE  Infectious mono screen, POC     Status: None   Collection Time: 05/07/17  1:36 PM  Result Value Ref Range   Mono Screen NEGATIVE NEGATIVE   No results found.  ED Clinical Impression  Pharyngitis, unspecified etiology   ED Assessment/Plan  Checking mono.  Rapid strep negative.  Given dexamethasone 10 mg IM x1, Tylenol 1 g and ibuprofen 800 mg p.o. here for pain.   [place] narcotic database reviewed.  No opiate prescriptions in 2 years.  Feel that it is reasonable to prescribe short course of Norco.   If mono is negative, concern for early peritonsillar abscess.  Plan to give a shot of Rocephin 1 g here, start on Augmentin, have him return to clinic in 48 hours if not getting any better, go immediately to the ER if he gets worse.  Discussed this with patient.  Patient also home with ibuprofen, Tylenol, Norco, Benadryl/Maalox mixture.. Patient to followup with PMD when necessary.   Mono negative.  Plan as above.  Discussed labs,  MDM, plan and followup with patient. Discussed sn/sx that should prompt return to the ED. patient agrees with plan.   Meds ordered this encounter  Medications  . dexamethasone (DECADRON) injection 10 mg  . ibuprofen (ADVIL,MOTRIN) tablet 800 mg  . DISCONTD: acetaminophen (TYLENOL) tablet 1,000 mg  . acetaminophen (TYLENOL) tablet 975 mg  . cefTRIAXone (ROCEPHIN) injection 1 g  . amoxicillin-clavulanate (AUGMENTIN) 875-125 MG tablet    Sig: Take 1 tablet by mouth 2 (two) times daily.    Dispense:  20 tablet    Refill:  0  . ibuprofen (ADVIL,MOTRIN) 600 MG tablet    Sig: Take 1 tablet (600 mg total) by mouth every 6 (six) hours as needed.    Dispense:  30 tablet    Refill:  0  . HYDROcodone-acetaminophen (NORCO/VICODIN) 5-325 MG tablet    Sig: Take 1-2 tablets by mouth every 6 (six) hours as needed for moderate pain.    Dispense:  20 tablet    Refill:  0     *This  clinic note was created using [employer]. Therefore, there may be occasional mistakes despite careful proofreading.    Melynda Ripple, MD 05/08/17 [phone]

## 2017-05-07 NOTE — Discharge Instructions (Signed)
1 gram of Tylenol and 600 mg ibuprofen together 3-4 times a day as needed for pain.  Make sure you drink plenty of extra fluids.  Some people find salt water gargles and  Traditional Medicinal's "Throat Coat" tea helpful. Take 5 mL of liquid Benadryl and 5 mL of Maalox. Mix it together, and then hold it in your mouth for as long as you can and then swallow. You may do this 4 times a day.  Return here in 2 days if not better, go immediately if you get worse or for any other concerns.  Go to www.goodrx.com to look up your medications. This will give you a list of where you can find your prescriptions at the most affordable prices. Or ask the pharmacist what the cash price is, or if they have any other discount programs available to help make your medication more affordable. This can be less expensive than what you would pay with insurance.

## 2017-05-09 ENCOUNTER — Ambulatory Visit
Admission: EM | Admit: 2017-05-09 | Discharge: 2017-05-09 | Disposition: A | Discharge status: Home / Self Care | Payer: 59 | Attending: Family Medicine | Admitting: Family Medicine

## 2017-05-09 ENCOUNTER — Other Ambulatory Visit: Payer: Self-pay

## 2017-05-09 ENCOUNTER — Encounter: Payer: Self-pay | Admitting: Gynecology

## 2017-05-09 DIAGNOSIS — J36 Peritonsillar abscess: Secondary | ICD-10-CM

## 2017-05-09 NOTE — ED Provider Notes (Signed)
[facility]    CSN: [id number] Arrival date & time: 05/09/17  1116     History   Chief Complaint No chief complaint on file.   HPI Ronnie Alexander is a 37 y.o. male.   Presents to the urgent care facility for evaluation of sore throat.  Patient comes in today for a recheck of his left side of sore throat.  He was seen 2 days ago, treated with Augmentin, Norco and ibuprofen.  Patient states his sore throat began 10 days ago, increased significantly and was severe 2 days ago.  He has seen noticeable mild improvement of the sore throat pain over the last 2 days with antibiotics.  Patient denies any fevers, drooling, trismus, change in voice.  He is tolerating p.o. Well.  Patient is still having moderate pain along the left peritonsillar region.    HPI  Past Medical History:  Diagnosis Date  . 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 Kidney stones   . Migraines    with aura, throbbing pain, with photo/phonophobia    Patient Active Problem List   Diagnosis Date Noted  . Sinusitis 03/29/2015  . Muscle pain 01/27/2015  . Elevated BP 10/02/2014  . Skin macule 10/02/2014  . Renal stones 05/11/2012  . Pain in joint, ankle and foot 05/11/2012  . Rhinitis 12/04/2011  . Anxiety 12/04/2011  . Cleft palate 06/28/2011  . Migraine with aura 06/28/2011  . Varicose vein 06/28/2011    Past Surgical History:  Procedure Laterality Date  . CLEFT PALATE REPAIR     17 total procedures  . FRACTURE SURGERY     L tibia facture       Home Medications    Prior to Admission medications   Medication Sig Start Date End Date Taking? Authorizing Provider  amoxicillin-clavulanate (AUGMENTIN) 875-125 MG tablet Take 1 tablet by mouth 2 (two) times daily. 05/07/17  Yes Melynda Ripple, MD  HYDROcodone-acetaminophen (NORCO/VICODIN) 5-325 MG tablet Take 1-2 tablets by mouth every 6 (six) hours as needed for moderate pain. 05/07/17  Yes Melynda Ripple, MD  ibuprofen (ADVIL,MOTRIN)  600 MG tablet Take 1 tablet (600 mg total) by mouth every 6 (six) hours as needed. 05/07/17  Yes Melynda Ripple, MD    Family History Family History  Problem Relation Age of Onset  . Alcohol abuse Mother   . Hyperlipidemia Mother   . Depression Mother   . COPD Maternal Grandfather     Social History Social History   Tobacco Use  . Smoking status: Former Smoker    Packs/day: 1.00    Years: 14.00    Pack years: 14.00    Types: Cigarettes    Last attempt to quit: 08/29/2010    Years since quitting: 6.6  . Smokeless tobacco: Never Used  Substance Use Topics  . Alcohol use: Yes    Alcohol/week: 0.0 oz    Comment: rare  . Drug use: No     Allergies   Citalopram   Review of Systems Review of Systems  Constitutional: Negative for fatigue and fever.  HENT: Positive for sore throat. Negative for rhinorrhea, trouble swallowing and voice change.   Respiratory: Negative for cough.   Gastrointestinal: Negative for nausea and vomiting.  Skin: Negative for rash.  Neurological: Negative for dizziness and light-headedness.     Physical Exam Triage Vital Signs ED Triage Vitals  Enc Vitals Group     BP 05/09/17 1127 (!) 143/86     Pulse  Rate 05/09/17 1127 78     Resp --      Temp 05/09/17 1127 98.2 F (36.8 C)     Temp Source 05/09/17 1127 Oral     SpO2 05/09/17 1127 98 %     Weight 05/09/17 1128 207 lb (93.9 kg)     Height 05/09/17 1128 6' (1.829 m)     Head Circumference --      Peak Flow --      Pain Score 05/09/17 1128 0     Pain Loc --      Pain Edu? --      Excl. in [place]? --    No data found.  Updated Vital Signs BP (!) 143/86 (BP Location: Left Arm)   Pulse 78   Temp 98.2 F (36.8 C) (Oral)   Ht 6' (1.829 m)   Wt 207 lb (93.9 kg)   SpO2 98%   BMI 28.07 kg/m   Visual Acuity Right Eye Distance:   Left Eye Distance:   Bilateral Distance:    Right Eye Near:   Left Eye Near:    Bilateral Near:     Physical Exam  Constitutional: He is oriented to  person, place, and time. He appears well-developed and well-nourished.  HENT:  Head: Normocephalic and atraumatic.  Left Ear: External ear normal.  Nose: Nose normal.  Mouth/Throat: Oropharynx is clear and moist. No oropharyngeal exudate.  Positive pharyngeal erythema, slightly worse on the left than the right.  Slight no significant shift of the uvula, there is enlargement of the left peritonsillar area.  Patient with tenderness to the left anterior cervical lymphadenopathy.  Eyes: Conjunctivae are normal.  Neck: Normal range of motion.  Cardiovascular: Normal rate.  Pulmonary/Chest: Effort normal. No stridor. No respiratory distress. He has no wheezes. He has no rales.  Abdominal: Soft. He exhibits no distension. There is no tenderness. There is no guarding.  Musculoskeletal: Normal range of motion.  Lymphadenopathy:    He has cervical adenopathy.  Neurological: He is alert and oriented to person, place, and time. Coordination normal.  Skin: Skin is warm. No rash noted. No pallor.  Psychiatric: He has a normal mood and affect. His behavior is normal.     UC Treatments / Results  Labs (all labs ordered are listed, but only abnormal results are displayed) Labs Reviewed - No data to display  EKG  EKG Interpretation None       Radiology No results found.  Procedures Procedures (including critical care time)  Medications Ordered in UC Medications - No data to display   Initial Impression / Assessment and Plan / UC Course  I have reviewed the triage vital signs and the nursing notes.  Pertinent labs & imaging results that were available during my care of the patient were reviewed by me and considered in my medical decision making (see chart for details).     37 year old male with 10-day history of sore throat, seen 2 days ago rapid strep test was negative cultures are still pending.  Mono test was negative.  He was treated with antibiotics, steroids and Norco along with  ibuprofen.  He has seen some improvement with pain and swelling.  His vital signs are stable, afebrile not tachycardic.  He is tolerating p.o. well.  No signs of trismus nor change in voice.  Discussed further treatment options with the patient consisting of getting further imaging with CT with contrast, patient not interested in imaging at this time.  I do believe  that patient is doing well enough and stable enough that he can continue treatment with the Augmentin for 2 more days and see if he has continued improvement.  Patient educated on signs and symptoms to return to the ED for for CT soft tissue cervical spine with contrast for such as fevers, increasing swelling, increasing pain, difficulty swallowing, trismus.  Patient is given information to follow-up with ENT.  Final Clinical Impressions(s) / UC Diagnoses   Final diagnoses:  Peritonsillar cellulitis    ED Discharge Orders    None        Duanne Guess, [place] 05/09/17 1207

## 2017-05-09 NOTE — ED Triage Notes (Signed)
Per patient was seen x 2 days ago for sore throat and was told that he has tonsil abscess. Per patient was given antibiotic and steroid shots. Per patient not feeling any better.

## 2017-05-09 NOTE — Discharge Instructions (Signed)
Please continue with Augmentin.  Monitor yourself for any fevers or increasing pain or size of the left tonsil.  If difficulty opening her mouth, swallowing, drooling, change in voice, increase in pain or size of the tonsil to the ER immediately for evaluation and treatment.

## 2017-05-10 LAB — CULTURE, GROUP A STREP (THRC)

## 2017-10-19 ENCOUNTER — Encounter: Payer: Self-pay | Admitting: Family Medicine

## 2017-10-19 ENCOUNTER — Ambulatory Visit (INDEPENDENT_AMBULATORY_CARE_PROVIDER_SITE_OTHER): Payer: 59 | Admitting: Family Medicine

## 2017-10-19 VITALS — BP 166/122 | HR 81 | Temp 97.7°F | Ht 72.0 in | Wt 215.8 lb

## 2017-10-19 DIAGNOSIS — I10 Essential (primary) hypertension: Secondary | ICD-10-CM | POA: Diagnosis not present

## 2017-10-19 DIAGNOSIS — Z23 Encounter for immunization: Secondary | ICD-10-CM

## 2017-10-19 DIAGNOSIS — Z0000 Encounter for general adult medical examination without abnormal findings: Secondary | ICD-10-CM | POA: Diagnosis not present

## 2017-10-19 DIAGNOSIS — Z7189 Other specified counseling: Secondary | ICD-10-CM

## 2017-10-19 DIAGNOSIS — Z636 Dependent relative needing care at home: Secondary | ICD-10-CM

## 2017-10-19 MED ORDER — HYDROCHLOROTHIAZIDE 12.5 MG PO TABS: 12.5000 mg | ORAL_TABLET | Freq: Every day | ORAL | 3 refills | Status: DC

## 2017-10-19 NOTE — Progress Notes (Signed)
CPE- See plan.  Routine anticipatory guidance given to patient.  See health maintenance.  The possibility exists that previously documented standard health maintenance information may have been brought forward from a previous encounter into this note.  If needed, that same information has been updated to reflect the current situation based on today's encounter.    Tetanus 2019 Flu encouraged.   PNA and shingles not due.  Colon and prostate cancer screening not due.  Living will d/w pt.  Wife designated if patient were incapacitated.   Diet and exercise d/w pt.  Encouraged both- affected by work schedule.    He had a job change, is doing more desk work and that was a sig change for patient.  He was bored with old job prior to the change.  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  His mood is lower with trouble concentrating at the desk job.  No suicidal or homicidal intent.  Home situation d/w pt.  His mother in law has sig care needs.  He is happy with his marriage and his daughter.    HTN.  Similar to today at home, 160s/110s usually.  Going on for months.  No CP, SOB, BLE edema.  Some HA occ.    PMH and SH reviewed Meds, vitals, and allergies reviewed.   ROS: Per HPI.  Unless specifically indicated otherwise in HPI, the patient denies:  General: fever. Eyes: acute vision changes ENT: sore throat Cardiovascular: chest pain Respiratory: SOB GI: vomiting GU: dysuria Musculoskeletal: acute back pain Derm: acute rash Neuro: acute motor dysfunction Psych: worsening mood Endocrine: polydipsia Heme: bleeding Allergy: hayfever  GEN: nad, alert and oriented HEENT: mucous membranes moist, chronic OP changes- postsurgical changes.   NECK: supple w/o LA CV: rrr. PULM: ctab, no inc wob ABD: soft, +bs EXT: no edema SKIN: no acute rash

## 2017-10-19 NOTE — Patient Instructions (Signed)
Go to the lab on the way out.  We'll contact you with your lab report. I would get a flu shot each fall.   Start HCTZ 12.5mg  a day.  Update me in about 10 days about your BP, if still >140/>90.   Take care.  Glad to see you.  Thanks for your effort.

## 2017-10-20 DIAGNOSIS — Z7189 Other specified counseling: Secondary | ICD-10-CM | POA: Insufficient documentation

## 2017-10-20 DIAGNOSIS — Z636 Dependent relative needing care at home: Secondary | ICD-10-CM | POA: Insufficient documentation

## 2017-10-20 DIAGNOSIS — Z0000 Encounter for general adult medical examination without abnormal findings: Secondary | ICD-10-CM | POA: Insufficient documentation

## 2017-10-20 LAB — COMPREHENSIVE METABOLIC PANEL
ALT: 33 U/L (ref 0–53)
AST: 22 U/L (ref 0–37)
Albumin: 4.5 g/dL (ref 3.5–5.2)
Alkaline Phosphatase: 57 U/L (ref 39–117)
BUN: 15 mg/dL (ref 6–23)
CALCIUM: 10.3 mg/dL (ref 8.4–10.5)
CO2: 29 meq/L (ref 19–32)
Chloride: 100 mEq/L (ref 96–112)
Creatinine, Ser: 1 mg/dL (ref 0.40–1.50)
GFR: 89.04 mL/min (ref >60.00)
Glucose, Bld: 80 mg/dL (ref 70–99)
Potassium: 4.4 mEq/L (ref 3.5–5.1)
Sodium: 137 mEq/L (ref 135–145)
Total Bilirubin: 0.5 mg/dL (ref 0.2–1.2)
Total Protein: 7.8 g/dL (ref 6.0–8.3)

## 2017-10-20 LAB — LIPID PANEL
Cholesterol: 222 mg/dL — ABNORMAL HIGH (ref 0–200)
HDL: 44.7 mg/dL (ref >39.00)
Total CHOL/HDL Ratio: 5

## 2017-10-20 LAB — LDL CHOLESTEROL, DIRECT: LDL DIRECT: 126 mg/dL

## 2017-10-20 NOTE — Assessment & Plan Note (Signed)
Support offered.  His mother-in-law requires significant care and she is living with them.  He is trying to work through the situation with his wife as best he can.  He will update me as needed.  I appreciate his effort.

## 2017-10-20 NOTE — Assessment & Plan Note (Signed)
Check routine labs today.  Continue work on diet and exercise.  He has a job change upcoming and that may be beneficial.  Start hydrochlorothiazide with routine cautions.  He can check blood pressure at home.  See after visit summary.  He agrees.

## 2017-10-20 NOTE — Assessment & Plan Note (Signed)
Tetanus 2019 Flu encouraged.   PNA and shingles not due.  Colon and prostate cancer screening not due.  Living will d/w pt.  Wife designated if patient were incapacitated.   Diet and exercise d/w pt.  Encouraged both- affected by work schedule.

## 2017-10-20 NOTE — Assessment & Plan Note (Signed)
Living will d/w pt.  Wife designated if patient were incapacitated.   ?

## 2017-10-25 ENCOUNTER — Other Ambulatory Visit: Payer: Self-pay | Admitting: Family Medicine

## 2017-10-25 DIAGNOSIS — E785 Hyperlipidemia, unspecified: Secondary | ICD-10-CM

## 2018-01-27 ENCOUNTER — Encounter: Payer: Self-pay | Admitting: Family Medicine

## 2018-01-27 ENCOUNTER — Ambulatory Visit: Payer: 59 | Admitting: Family Medicine

## 2018-01-27 DIAGNOSIS — H6692 Otitis media, unspecified, left ear: Secondary | ICD-10-CM

## 2018-01-27 DIAGNOSIS — H6690 Otitis media, unspecified, unspecified ear: Secondary | ICD-10-CM

## 2018-01-27 MED ORDER — CITALOPRAM HYDROBROMIDE 10 MG PO TABS
10.0000 mg | ORAL_TABLET | Freq: Every day | ORAL | 3 refills | Status: DC
Start: 2018-01-27 — End: 2018-04-13

## 2018-01-27 MED ORDER — AMOXICILLIN-POT CLAVULANATE 875-125 MG PO TABS: 1.0000 | ORAL_TABLET | Freq: Two times a day (BID) | ORAL | 0 refills | Status: DC

## 2018-01-27 NOTE — Progress Notes (Signed)
He had relief from citalopram but had ADE at 20mg .  He has higher stress level at work and was asking about lower dose, ie 10mg  trial.  He is worried about work.  No SI/HI.  He is in management at work now.  He thought it would be worth a trial of 10 mg to see if he could tolerate it and get a good effect from the medication.  Discussed.  No suicidal or homicidal intent.  L ear pain.  Sx started about 3 days ago.  Pain, some bloody discharge.  No R ear pain.  No FCNAVD.  He doesn't feel sick o/w.   Meds, vitals, and allergies reviewed.   ROS: Per HPI unless specifically indicated in ROS section   nad ncat R TM w/o erythema.  Right mastoid nontender. L TM perf with erythema.  L mastoid slightly ttp.   Nasal exam slightly stuffy. Mucous membranes moist, oropharynx without erythema but tonsillar enlargement noted at baseline Neck supple, no lymphadenopathy rrr ctab

## 2018-01-27 NOTE — Patient Instructions (Addendum)
If your BP is consistently >140/>90 when you are feeling well, then restart HCTZ.  Update me as needed.   Start augmentin in the meantime.  Take care.  Glad to see you.

## 2018-01-30 DIAGNOSIS — H6690 Otitis media, unspecified, unspecified ear: Secondary | ICD-10-CM | POA: Insufficient documentation

## 2018-01-30 NOTE — Assessment & Plan Note (Signed)
Start Augmentin.  Update me as needed.  Still okay for outpatient follow-up.  Discussed with patient about his blood pressure.  If his blood pressure remains persistently greater than 161 systolic or greater than 90 diastolic then he can restart hydrochlorothiazide and update me.  He is off medication at this point.  It is quite possible that his acute illness has affected his blood pressure today and may not have an elevated reading when he is feeling better.  He agrees with plan.

## 2018-02-01 ENCOUNTER — Ambulatory Visit (INDEPENDENT_AMBULATORY_CARE_PROVIDER_SITE_OTHER): Payer: 59 | Admitting: Urology

## 2018-02-01 ENCOUNTER — Encounter: Payer: Self-pay | Admitting: Urology

## 2018-02-01 VITALS — BP 169/117 | HR 77 | Ht 72.0 in | Wt 217.5 lb

## 2018-02-01 DIAGNOSIS — Z3009 Encounter for other general counseling and advice on contraception: Secondary | ICD-10-CM

## 2018-02-01 MED ORDER — DIAZEPAM 5 MG PO TABS: 5.0000 mg | ORAL_TABLET | Freq: Once | ORAL | 0 refills | Status: DC | PRN

## 2018-02-01 NOTE — Progress Notes (Signed)
02/01/2018 9:12 AM   Ronnie Alexander 1980/06/21 [id number]  Referring provider: Tonia Ghent, MD Ronnie Alexander, [place] [address]  CC: Desire for vasectomy  HPI: I saw Mr. Ronnie Alexander in urology clinic today in consultation for surgical sterilization from Dr. Damita Alexander.  He is a 37 year old healthy male with a 60-year-old daughter who is married.  There are no aggravating or alleviating factors.  He denies any gross hematuria, urinary symptoms, or erectile dysfunction.   PMH: Past Medical History:  Diagnosis Date  . 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 Kidney stones   . Migraines    with aura, throbbing pain, with photo/phonophobia    Surgical History: Past Surgical History:  Procedure Laterality Date  . CLEFT PALATE REPAIR     17 total procedures  . FRACTURE SURGERY     L tibia facture    Allergies:  Allergies  Allergen Reactions  . Citalopram     At dose of 20mg  or higher- lack of ejaculation    Family History: Family History  Problem Relation Age of Onset  . Alcohol abuse Mother   . Hyperlipidemia Mother   . Depression Mother   . COPD Maternal Grandfather   . Colon cancer Neg Hx   . Prostate cancer Neg Hx     Social History:  reports that he quit smoking about 7 years ago. His smoking use included cigarettes. He has a 14.00 pack-year smoking history. He has never used smokeless tobacco. He reports that he drinks alcohol. He reports that he does not use drugs.  ROS: Please see flowsheet from today's date for complete review of systems.  Physical Exam: BP (!) 169/117 (BP Location: Left Arm, Patient Position: Sitting, Cuff Size: Normal)   Pulse 77   Ht 6' (1.829 m)   Wt 217 lb 8 oz (98.7 kg)   BMI 29.50 kg/m    Constitutional:  Alert and oriented, No acute distress. Cardiovascular: No clubbing, cyanosis, or edema. Respiratory: Normal respiratory effort, no increased work of breathing. GI: Abdomen is soft, nontender, nondistended, no  abdominal masses GU: No CVA tenderness, phallus without lesions, widely patent meatus Testicles descended and 20 cc bilaterally, no masses, vas deferens palpable bilaterally Lymph: No cervical or inguinal lymphadenopathy. Skin: No rashes, bruises or suspicious lesions. Neurologic: Grossly intact, no focal deficits, moving all 4 extremities. Psychiatric: Normal mood and affect.  Laboratory Data: None to review  Pertinent Imaging: None to review  Assessment & Plan:   In summary, Mr. Ronnie Alexander is a healthy married 37 year old male with a 56-year-old daughter who desires vasectomy.  We discussed the risks and benefits of vasectomy at length.  Vasectomy is intended to be a permanent form of contraception, and does not produce immediate sterility.  Following vasectomy another form of contraception is required until vas occlusion is confirmed by a post-vasectomy semen analysis obtained 2-3 months after the procedure.  Even after vas occlusion is confirmed, vasectomy is not 100% reliable in preventing pregnancy, and the failure rate is approximately 02/1998.  Repeat vasectomy is required in less than 1% of patients.  He should refrain from ejaculation for 1 week after vasectomy.  Options for fertility after vasectomy include vasectomy reversal, and sperm retrieval with in vitro fertilization or ICSI.  These options are not always successful and may be expensive.  Finally, there are other permanent and non-permanent alternatives to vasectomy available. There is no risk of erectile dysfunction, and the volume of semen  will be similar to prior, as the majority of the ejaculate is from the prostate and seminal vesicles.   The procedure takes ~20 minutes.  We recommend patients take 5-10 mg of Valium 30 minutes prior, and he will need a driver post-procedure.  Local anesthetic is injected into the scrotal skin and a small segment of the vas deferens is removed, and the ends occluded. The complication rate is  approximately 1-2%, and includes bleeding, infection, and development of chronic scrotal pain.  PLAN: Pending insurance approval, will schedule for vasectomy at his [facility], [place] [address] [place], [place] [address] [phone]

## 2018-02-01 NOTE — Addendum Note (Signed)
Addended by: Billey Co on: 02/01/2018 12:59 PM   Modules accepted: Orders

## 2018-02-14 ENCOUNTER — Telehealth: Payer: Self-pay | Admitting: *Deleted

## 2018-02-14 ENCOUNTER — Other Ambulatory Visit: Payer: Self-pay | Admitting: Family Medicine

## 2018-02-14 DIAGNOSIS — H60509 Unspecified acute noninfective otitis externa, unspecified ear: Secondary | ICD-10-CM

## 2018-02-14 MED ORDER — CIPROFLOXACIN-DEXAMETHASONE 0.3-0.1 % OT SUSP: 4.0000 [drp] | Freq: Two times a day (BID) | OTIC | 0 refills | Status: DC

## 2018-02-14 NOTE — Telephone Encounter (Signed)
Spoken and notified patient of Dr Duncan's comments. Patient verbalized understanding.  

## 2018-02-14 NOTE — Telephone Encounter (Signed)
Spoke to pt who states he was recently seen and given an abx for his ear infection. He was to complete the abx and contact office back if no improvement, per Dr Damita Dunnings. Pt states he is still experiencing ear pain and is requesting the ear drops Dr Damita Dunnings suggested. Rx can be sent to [facility] on file. pls advise

## 2018-02-14 NOTE — Telephone Encounter (Signed)
rx sent for cipro drops.  Needs f/u if not better after this.   Thanks.

## 2018-02-17 MED ORDER — NEOMYCIN-POLYMYXIN-HC 3.5-10000-1 OT SOLN: 4.0000 [drp] | Freq: Four times a day (QID) | OTIC | 0 refills | Status: DC

## 2018-02-17 NOTE — Telephone Encounter (Signed)
Based on notes. Suspect otitis externa.   Will send in alternative.   If that is expensive. Recommend patient ask pharmacy what otic antibiotic would be less expensive to help aid prescribing.   Lesleigh Noe

## 2018-02-17 NOTE — Telephone Encounter (Signed)
Spoken and notified patient of Dr Cody's comments. Patient verbalized understanding.

## 2018-02-17 NOTE — Telephone Encounter (Addendum)
Pt last seen 01/27/18; lt earache continues with a dark green & yellow drainage from the ear. No fever.; pt has hx of perforated ear drum. Pt is not having any new difficulty hearing out of lt ear.   Pt cannot afford the ciprofloxacin-dexamethasone suspension  and request less expensive abx drops.[facility] Please advise. Dr Damita Dunnings out of office.

## 2018-02-17 NOTE — Addendum Note (Signed)
Addended by: Lesleigh Noe on: 02/17/2018 04:08 PM   Modules accepted: Orders

## 2018-03-09 ENCOUNTER — Encounter: Payer: 59 | Admitting: Urology

## 2018-04-07 ENCOUNTER — Telehealth: Payer: Self-pay

## 2018-04-07 NOTE — Telephone Encounter (Signed)
Pt said citalopram 10 mg is not effective; for last 2 weeks anxiety is occurring regularly; pt is not having panic attacks; and is not bothered that much by depression; stress is becoming too much personally and work related. Pt is still productive and having energy.No SI/HI. Pt last seen and discussed anxiety on 01/27/18. Pt wants to know if can increase citalopram or change to a different medication. Pt request cb. [facility].

## 2018-04-08 NOTE — Telephone Encounter (Signed)
Left detailed message on voicemail.  

## 2018-04-08 NOTE — Telephone Encounter (Signed)
Please let know patient that I am considering options in the meantime.  I need to give this some more thought.  I will get back in touch with him as quickly as I can.  Thanks.

## 2018-04-13 MED ORDER — VENLAFAXINE HCL 37.5 MG PO TABS: 37.5000 mg | ORAL_TABLET | Freq: Two times a day (BID) | ORAL | 5 refills | Status: DC

## 2018-04-13 MED ORDER — VENLAFAXINE HCL ER 37.5 MG PO CP24: 37.5000 mg | ORAL_CAPSULE | Freq: Every day | ORAL | 5 refills | Status: DC

## 2018-04-13 NOTE — Telephone Encounter (Addendum)
I would stop the citalpram and change to venlafaxine.  At such a low dose of citalopram, he should be able to stop it and then start venlafaxine the next day.  I would take venlafaxine 37.5 mg daily for 5 days then increase to 37.5 mg twice a day after that.  Let me know how that goes.  Update me as needed in the meantime.  Thanks.  Prescription sent.

## 2018-04-13 NOTE — Telephone Encounter (Signed)
Left message on voicemail for patient to call back. 

## 2018-04-13 NOTE — Addendum Note (Signed)
Addended by: Tonia Ghent on: 04/13/2018 04:23 PM   Modules accepted: Orders

## 2018-04-13 NOTE — Telephone Encounter (Signed)
Patient notified as instructed by telephone and verbalized understanding. 

## 2018-04-14 NOTE — Telephone Encounter (Signed)
[facility] Night - Client Nonclinical Telephone Record [facility] Night - Client Client Site [facility] - Night Physician Renford Dills - MD Contact Type Call Who Is Calling Patient / Member / Family / Caregiver Caller Name [place] Phone Number [phone] Call Type Message Only Information Provided Reason for Call Returning a Call from the Office Initial [place] states he is returning a call to the office. Additional Comment Call Closed By: Memory Argue Transaction Date/Time: 04/13/2018 5:20:00 PM (ET)   Per phone note pt was notified on 04/13/18 at 5:24.

## 2018-05-12 ENCOUNTER — Encounter: Payer: 59 | Admitting: Urology

## 2018-07-18 ENCOUNTER — Encounter: Payer: Self-pay | Admitting: Family Medicine

## 2018-07-20 ENCOUNTER — Other Ambulatory Visit: Payer: Self-pay | Admitting: Family Medicine

## 2018-07-20 DIAGNOSIS — I10 Essential (primary) hypertension: Secondary | ICD-10-CM

## 2018-07-20 MED ORDER — HYDROCHLOROTHIAZIDE 12.5 MG PO TABS: 25.0000 mg | ORAL_TABLET | Freq: Every day | ORAL | Status: DC

## 2018-07-25 ENCOUNTER — Other Ambulatory Visit: Payer: Self-pay

## 2018-07-25 ENCOUNTER — Ambulatory Visit (INDEPENDENT_AMBULATORY_CARE_PROVIDER_SITE_OTHER): Payer: 59 | Admitting: Family Medicine

## 2018-07-25 DIAGNOSIS — Z636 Dependent relative needing care at home: Secondary | ICD-10-CM | POA: Diagnosis not present

## 2018-07-25 DIAGNOSIS — I10 Essential (primary) hypertension: Secondary | ICD-10-CM

## 2018-07-25 DIAGNOSIS — J019 Acute sinusitis, unspecified: Secondary | ICD-10-CM

## 2018-07-25 MED ORDER — AMOXICILLIN-POT CLAVULANATE 875-125 MG PO TABS: 1.0000 | ORAL_TABLET | Freq: Two times a day (BID) | ORAL | 0 refills | Status: DC

## 2018-07-25 NOTE — Progress Notes (Signed)
Virtual visit completed through WebEx or similar program Patient location: home  Provider location: Financial controller at [facility], office   Pandemic considerations d/w pt.   Limitations and rationale for visit method d/w patient.  Patient agreed to proceed.   CC: sinus pain.    HPI:  If BP is lower in the future then he may be able to cut back to 12.5mg  HCTZ, d/w pt.   duration of symptoms: about 5 days, started with HA, around the eyes B.  Upper tooth pain.  Similar to prev sinus infections.  Rhinorrhea: some, discolored.   Congestion: yes ear pain: no sore throat: irritated throat but not fevers.  Cough: minimal  Myalgias: no No fevers.  No known covid exposure.   Not SOB.    He is out of work today.  He had sun sensitivity with sun exposure on venlafaxine.  That is improved/resolved in the meantime.  Venlafaxine helped o/w.  D/w pt about sun cautions.  He wanted to continue with med given the prev help from med.    Meds and allergies reviewed.   ROS: Per HPI unless specifically indicated in ROS section   NAD Speech wnl  A/P:  Likely sinus infection, start augmentin, work note done.  He can get that via mychart.  Supportive care o/w.  Update [place] as needed.    If BP is lower in the future then he may be able to cut back to 12.5mg  HCTZ, d/w pt.   He had sun sensitivity with sun exposure on venlafaxine.  That is improved/resolved in the meantime.  Venlafaxine helped o/w.  D/w pt about sun cautions.  He wanted to continue with med given the prev help from med.

## 2018-07-27 NOTE — Assessment & Plan Note (Signed)
Likely sinus infection, start augmentin, work note done.  He can get that via mychart.  Supportive care o/w.  Update [place] as needed.

## 2018-07-27 NOTE — Assessment & Plan Note (Signed)
If BP is lower in the future then he may be able to cut back to 12.5mg  HCTZ, d/w pt.

## 2018-07-27 NOTE — Assessment & Plan Note (Signed)
  He had sun sensitivity with sun exposure on venlafaxine.  That is improved/resolved in the meantime.  Venlafaxine helped o/w.  D/w pt about sun cautions.  He wanted to continue with med given the prev help from med.

## 2018-08-06 ENCOUNTER — Encounter: Payer: Self-pay | Admitting: Family Medicine

## 2018-08-08 ENCOUNTER — Other Ambulatory Visit: Payer: Self-pay | Admitting: *Deleted

## 2018-08-08 DIAGNOSIS — I10 Essential (primary) hypertension: Secondary | ICD-10-CM

## 2018-08-08 MED ORDER — HYDROCHLOROTHIAZIDE 12.5 MG PO TABS: 25.0000 mg | ORAL_TABLET | Freq: Every day | ORAL | 5 refills | Status: DC

## 2018-10-25 ENCOUNTER — Other Ambulatory Visit: Payer: Self-pay | Admitting: Family Medicine

## 2018-11-03 ENCOUNTER — Telehealth: Payer: Self-pay

## 2018-11-03 NOTE — Telephone Encounter (Signed)
Pt scheduled 12/15/18 @ 4pm

## 2018-11-03 NOTE — Telephone Encounter (Signed)
[facility] Night - Client Nonclinical Telephone Record [facility] Night - Client Client Site [facility] Physician Renford Dills - MD Contact Type Call Who Is Calling Patient / Member / Family / Caregiver Caller Name [place] Phone Number [phone] Patient Name Ronnie Alexander Patient DOB 1980-11-21 Call Type Message Only Information Provided Reason for Call Request to Schedule Office Appointment Initial Comment Caller wants to make an appointment for a physical. Additional Comment Office hours were provided. Call Closed By: Jaclyn Prime Transaction Date/Time: 11/03/2018 6:52:56 AM (ET)

## 2018-11-22 ENCOUNTER — Telehealth: Payer: Self-pay | Admitting: Family Medicine

## 2018-11-22 NOTE — Telephone Encounter (Signed)
CPE scheduled  

## 2018-11-22 NOTE — Telephone Encounter (Addendum)
Please talk to me about this patient.  Thanks. 

## 2018-11-22 NOTE — Telephone Encounter (Signed)
Opened in error

## 2018-11-25 ENCOUNTER — Ambulatory Visit: Payer: 59 | Admitting: Family Medicine

## 2018-11-25 ENCOUNTER — Telehealth: Payer: Self-pay | Admitting: Family Medicine

## 2018-11-25 NOTE — Telephone Encounter (Signed)
That is fine. Thanks 

## 2018-11-25 NOTE — Telephone Encounter (Signed)
Worked patient in per his availability, which are fridays. Scheduled for 10/9 @8 :00am Every Friday you are over booked with Physicals, let me know if this will not workand I will contact the patient and we will look in November or longer .

## 2018-12-02 ENCOUNTER — Ambulatory Visit (INDEPENDENT_AMBULATORY_CARE_PROVIDER_SITE_OTHER): Payer: 59 | Admitting: Family Medicine

## 2018-12-02 ENCOUNTER — Other Ambulatory Visit: Payer: Self-pay

## 2018-12-02 ENCOUNTER — Encounter: Payer: Self-pay | Admitting: Family Medicine

## 2018-12-02 VITALS — BP 140/92 | HR 73 | Temp 98.0°F | Ht 71.5 in | Wt 193.9 lb

## 2018-12-02 DIAGNOSIS — Z0000 Encounter for general adult medical examination without abnormal findings: Secondary | ICD-10-CM

## 2018-12-02 DIAGNOSIS — Z7189 Other specified counseling: Secondary | ICD-10-CM

## 2018-12-02 DIAGNOSIS — I10 Essential (primary) hypertension: Secondary | ICD-10-CM

## 2018-12-02 DIAGNOSIS — Z8679 Personal history of other diseases of the circulatory system: Secondary | ICD-10-CM

## 2018-12-02 DIAGNOSIS — Z659 Problem related to unspecified psychosocial circumstances: Secondary | ICD-10-CM

## 2018-12-02 LAB — BASIC METABOLIC PANEL
BUN: 14 mg/dL (ref 6–23)
CO2: 30 mEq/L (ref 19–32)
Calcium: 9.3 mg/dL (ref 8.4–10.5)
Chloride: 102 mEq/L (ref 96–112)
Creatinine, Ser: 0.95 mg/dL (ref 0.40–1.50)
GFR: 88.35 mL/min (ref >60.00)
Glucose, Bld: 96 mg/dL (ref 70–99)
Potassium: 4 mEq/L (ref 3.5–5.1)
Sodium: 139 mEq/L (ref 135–145)

## 2018-12-02 LAB — LIPID PANEL
Cholesterol: 152 mg/dL (ref 0–200)
HDL: 53.3 mg/dL (ref >39.00)
LDL Cholesterol: 83 mg/dL (ref 0–99)
NonHDL: 98.2
Total CHOL/HDL Ratio: 3
Triglycerides: 76 mg/dL (ref 0.0–149.0)
VLDL: 15.2 mg/dL (ref 0.0–40.0)

## 2018-12-02 NOTE — Patient Instructions (Signed)
We'll check on the psychiatry appointment.  Go to the lab on the way out.  We'll contact you with your lab report. Take care.  Glad to see you.  I would get a flu shot each fall.

## 2018-12-02 NOTE — Assessment & Plan Note (Signed)
Living will d/w pt.  Sister Madalyn Rob designated if patient were incapacitated.

## 2018-12-02 NOTE — Progress Notes (Signed)
CPE- See plan.  Routine anticipatory guidance given to patient.  See health maintenance.  The possibility exists that previously documented standard health maintenance information may have been brought forward from a previous encounter into this note.  If needed, that same information has been updated to reflect the current situation based on today's encounter.    Tetanus 2019 Flu encouraged. PNA and shingles not due.  Colon and prostate cancer screening not due.  Living will d/w pt.  Sister Madalyn Rob designated if patient were incapacitated.   Diet and exercise d/w pt.   We talked about his social situation.  He is separated from his wife.  D/w pt about psych eval.  He is living out of the home.  He is still working.  No SI/HI- d/w pt.  He didn't think that venlafaxine was helpful, off med currently.  He tapered off.  He isn't seeing his daughter daily and that is a sig change for patient, discussed.  He doesn't have panic sx now.  Minimal etoh.  No illicits.    His BP is reasonable and he is off HCTZ in the meantime.  D/w pt.  Intentional weight loss noted.  He has been working out.  Healthy diet, d/w pt.  No CP, not SOB.  See notes on labs.  No migraines.    PMH and SH reviewed  Meds, vitals, and allergies reviewed.   ROS: Per HPI.  Unless specifically indicated otherwise in HPI, the patient denies:  General: fever. Eyes: acute vision changes ENT: sore throat Cardiovascular: chest pain Respiratory: SOB GI: vomiting GU: dysuria Musculoskeletal: acute back pain Derm: acute rash Neuro: acute motor dysfunction Psych: worsening mood Endocrine: polydipsia Heme: bleeding Allergy: hayfever  GEN: nad, alert and oriented HEENT: ncat NECK: supple w/o LA CV: rrr. PULM: ctab, no inc wob ABD: soft, +bs EXT: no edema SKIN: no acute rash

## 2018-12-04 DIAGNOSIS — Z659 Problem related to unspecified psychosocial circumstances: Secondary | ICD-10-CM | POA: Insufficient documentation

## 2018-12-04 NOTE — Assessment & Plan Note (Signed)
We talked about his social situation.  He is separated from his wife.  D/w pt about psych eval.  He is living out of the home.  He is still working.  No SI/HI- d/w pt.  He didn't think that venlafaxine was helpful, off med currently.  He tapered off.  He isn't seeing his daughter daily and that is a sig change for patient, discussed.  He doesn't have panic sx now.  Minimal etoh.  No illicits.   Refer to psychiatry.

## 2018-12-04 NOTE — Assessment & Plan Note (Signed)
  Tetanus 2019 Flu encouraged. PNA and shingles not due.  Colon and prostate cancer screening not due.  Living will d/w pt.  Sister Madalyn Rob designated if patient were incapacitated.   Diet and exercise d/w pt.

## 2018-12-04 NOTE — Assessment & Plan Note (Signed)
History of. His BP is reasonable and he is off HCTZ in the meantime.  D/w pt.  Intentional weight loss noted.  He has been working out.  Healthy diet, d/w pt.  No CP, not SOB.  See notes on labs.  Continue off medication for now.

## 2018-12-15 ENCOUNTER — Ambulatory Visit: Payer: 59 | Admitting: Family Medicine

## 2019-01-06 ENCOUNTER — Ambulatory Visit (INDEPENDENT_AMBULATORY_CARE_PROVIDER_SITE_OTHER): Payer: 59 | Admitting: Psychiatry

## 2019-01-06 ENCOUNTER — Other Ambulatory Visit: Payer: Self-pay

## 2019-01-06 ENCOUNTER — Encounter: Payer: Self-pay | Admitting: Psychiatry

## 2019-01-06 DIAGNOSIS — F4322 Adjustment disorder with anxiety: Secondary | ICD-10-CM | POA: Diagnosis not present
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
## 2019-01-16 ENCOUNTER — Ambulatory Visit (INDEPENDENT_AMBULATORY_CARE_PROVIDER_SITE_OTHER): Payer: 59 | Admitting: Licensed Clinical Social Worker

## 2019-01-16 ENCOUNTER — Other Ambulatory Visit: Payer: Self-pay

## 2019-01-16 DIAGNOSIS — F4322 Adjustment disorder with anxiety: Secondary | ICD-10-CM
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
## 2019-02-02 ENCOUNTER — Ambulatory Visit (INDEPENDENT_AMBULATORY_CARE_PROVIDER_SITE_OTHER): Payer: 59 | Admitting: Licensed Clinical Social Worker

## 2019-02-02 ENCOUNTER — Other Ambulatory Visit: Payer: Self-pay

## 2019-02-02 DIAGNOSIS — F4322 Adjustment disorder with anxiety: Secondary | ICD-10-CM | POA: Diagnosis not present

## 2019-02-02 NOTE — Progress Notes (Signed)
Virtual Visit via Video Note   I connected with Elvie Maines on 02/02/19 at 4:00pm by [employer] video meeting and verified that I am speaking with the correct person using two identifiers.   I discussed the limitations, risks, security and privacy concerns of performing an evaluation and management service by telephone and the availability of in person appointments. I also discussed with the patient that there may be a patient responsible charge related to this service. The patient expressed understanding and agreed to proceed.   I discussed the assessment and treatment plan with the patient. The patient was provided an opportunity to ask questions and all were answered. The patient agreed with the plan and demonstrated an understanding of the instructions.   The patient was advised to call back or seek an in-person evaluation if the symptoms worsen or if the condition fails to improve as anticipated.   I provided 30 minutes of non-face-to-face time during this encounter.     Shade Flood, LCSW, LCASA ________________________ THERAPIST PROGRESS NOTE  Session Time: 4:00 pm - 4:30pm  Participation Level: Active   Behavioral Response: Alert, well groomed, positive mood   Type of Therapy:  Individual Therapy  Treatment Goals addressed: Journaling, Emotional regulation, socialization   Interventions: CBT  Summary: Coreon presented for video session today on time as planned.  Kanton was alert, oriented x5, with no evidence or self-report of SI/HI or A/V H at this time.  He denied any alcohol or substance abuse.  Tavaris reported scores of 3/10 for anxiety and depression today, stating "Things seem to be going a lot better for me right now.  My ex-wife seems to be softening up so I can see my daughter more, and the lawyers are giving me good news about the custody situation".  Denise denied journaling regularly, stating "I guess I haven't even thought about it since things have been going  well".  Benno reported that he believes the most significant changes in behavior he has made were going hiking with his daughter, keeping a morning ritual of quiet time to drink coffee before work, and listening to 'mellow' music throughout the day to keep his mind in a positive frame.  Drayton acknowledged negative automatic thoughts in relation to his daughter such as "Will she resent me or understand the reason behind the divorce?" and reported that he would counter these by reminding himself of the effort he is currently putting into being a good present father now, and talk to positive supports who will help him interrupt these thoughts when struggling.      Suicidal/Homicidal: None, without intent or plan   Therapist Response: Clinician met with Larkin Ina via video session today and assessed for safety and sobriety.  Clinician inquired about Mcadoo's present emotional ratings, as well as recurrent thoughts, feelings, or behaviors which have contributed to mood changes.  Clinician inquired about what behaviors Jaedan has been engaging in to lift his mood and outlook, and how effective these have been. Clinician utilized CBT with Jabril to confront automatic thoughts that influence his anxiety and replace them with more rational thoughts to maintain positive mood throughout rest of week.  Clinician reminded Michail of goal to journal about his thoughts, feelings, and behavior more consistently to monitor efficacy of coping skills being utilized week to week, including getting enough exercise and reenagaging in positive hobbies after work.  Clinician will continue to monitor.   Plan: Follow up in 1 week virtually.    Diagnosis: Adjustment disorder with  anxious mood   Shade Flood, [place], [place] 02/02/19

## 2019-05-04 ENCOUNTER — Ambulatory Visit: Payer: 59 | Admitting: Family Medicine

## 2019-05-04 ENCOUNTER — Encounter: Payer: Self-pay | Admitting: Family Medicine

## 2019-05-04 ENCOUNTER — Other Ambulatory Visit: Payer: Self-pay

## 2019-05-04 DIAGNOSIS — F4322 Adjustment disorder with anxiety: Secondary | ICD-10-CM

## 2019-05-04 MED ORDER — BUSPIRONE HCL 5 MG PO TABS: 5.0000 mg | ORAL_TABLET | Freq: Two times a day (BID) | ORAL | 1 refills | Status: DC

## 2019-05-04 NOTE — Progress Notes (Signed)
This visit occurred during the SARS-CoV-2 public health emergency.  Safety protocols were in place, including screening questions prior to the visit, additional usage of staff PPE, and extensive cleaning of exam room while observing appropriate contact time as indicated for disinfecting solutions.  Anxiety/adjustment.  He is separated from his wife.  "The bad days are more than the good days now" per patient report.  More anxiety in the meantime.  "I'm trying to accept the things that I can't control."  He has limited visitation with his daughter.  He isn't drinking etoh except for rarely on the weekend.  He is living in [place], moving soon to [place] and that would be easier for visitation.  Some days anxiety is worse than others.    Per patient he is still sending money to his wife and has been to counseling.  He was "on an upswing" but then his mood got worse in the meantime.  No SI/HI.  Prev intolerance to SSRI, d/w pt.  He is still working in the meantime.  His boss offered to help with transportation in the meantime.   Meds, vitals, and allergies reviewed.   ROS: Per HPI unless specifically indicated in ROS section   GEN: nad, alert and oriented HEENT: ncat NECK: supple w/o LA CV: rrr. PULM: ctab, no inc wob EXT: no edema SKIN: Well-perfused. No tremor.  Speech normal.

## 2019-05-04 NOTE — Patient Instructions (Signed)
Try taking buspar 1 tab twice a day.  If tolerated but not effective enough after 1 week, then okay to gradually increase to 2 tabs twice a day.  Let me know how this works in the next 10-14 days, sooner if needed.  Take care.  Glad to see you.

## 2019-05-07 NOTE — Assessment & Plan Note (Signed)
Discussed options.  He can try taking buspar 1 tab twice a day.  If tolerated but not effective enough after 1 week, then okay to gradually increase to 2 tabs twice a day.  He can let me know how this works in the next 10-14 days, sooner if needed.  Okay for outpatient follow-up.  He agrees with plan.

## 2019-07-21 ENCOUNTER — Other Ambulatory Visit: Payer: Self-pay | Admitting: *Deleted

## 2019-07-21 MED ORDER — BUSPIRONE HCL 5 MG PO TABS: 10.0000 mg | ORAL_TABLET | Freq: Two times a day (BID) | ORAL | 2 refills | Status: DC

## 2019-07-21 NOTE — Telephone Encounter (Signed)
Left message for Aladino to call office to let us know how he is currently taking the buspirone.

## 2019-07-21 NOTE — Telephone Encounter (Signed)
Patient called back He stated that he is still taking this medication. He said that he has started taking 2 a day and sometimes 3. This is why he is requesting the refill so he does not run out

## 2019-09-16 ENCOUNTER — Other Ambulatory Visit: Payer: Self-pay

## 2019-09-16 ENCOUNTER — Ambulatory Visit: Payer: No Typology Code available for payment source | Attending: Internal Medicine

## 2019-09-16 DIAGNOSIS — Z23 Encounter for immunization: Secondary | ICD-10-CM

## 2019-09-16 NOTE — Progress Notes (Signed)
   Covid-19 Vaccination Clinic  Name:  YADER CRIGER    MRN: [id number] DOB: 05-24-1980  09/16/2019  Mr. Manzo was observed post Covid-19 immunization for 15 minutes without incident. He was provided with Vaccine Information Sheet and instruction to access the V-Safe system.   Mr. Hargadon was instructed to call 911 with any severe reactions post vaccine: Marland Kitchen Difficulty breathing  . Swelling of face and throat  . A fast heartbeat  . A bad rash all over body  . Dizziness and weakness   Immunizations Administered    Name Date Dose VIS Date Route   Pfizer COVID-19 Vaccine 09/16/2019 11:49 AM 0.3 mL 04/19/2018 Intramuscular   Manufacturer: [place]   Lot: [id number]   [place]: [id number]

## 2019-10-09 ENCOUNTER — Ambulatory Visit: Payer: No Typology Code available for payment source | Attending: Internal Medicine

## 2019-10-09 DIAGNOSIS — Z23 Encounter for immunization: Secondary | ICD-10-CM

## 2019-10-09 NOTE — Progress Notes (Signed)
   Covid-19 Vaccination Clinic  Name:  Ronnie Alexander    MRN: [id number] DOB: 1980/05/04  10/09/2019  Ronnie Alexander was observed post Covid-19 immunization for 15 minutes without incident. He was provided with Vaccine Information Sheet and instruction to access the V-Safe system.   Ronnie Alexander was instructed to call 911 with any severe reactions post vaccine: Marland Kitchen Difficulty breathing  . Swelling of face and throat  . A fast heartbeat  . A bad rash all over body  . Dizziness and weakness   Immunizations Administered    Name Date Dose VIS Date Route   Pfizer COVID-19 Vaccine 10/09/2019  4:22 PM 0.3 mL 04/19/2018 Intramuscular   Manufacturer: [place]   Lot: [id number]   [place]: [id number]

## 2019-12-28 ENCOUNTER — Encounter: Payer: Self-pay | Admitting: Family Medicine

## 2019-12-28 ENCOUNTER — Other Ambulatory Visit: Payer: Self-pay

## 2019-12-28 ENCOUNTER — Ambulatory Visit (INDEPENDENT_AMBULATORY_CARE_PROVIDER_SITE_OTHER): Payer: 59 | Admitting: Family Medicine

## 2019-12-28 VITALS — BP 140/80 | HR 76 | Temp 96.5°F | Ht 71.5 in | Wt 211.8 lb

## 2019-12-28 DIAGNOSIS — Z711 Person with feared health complaint in whom no diagnosis is made: Secondary | ICD-10-CM

## 2019-12-28 DIAGNOSIS — Z113 Encounter for screening for infections with a predominantly sexual mode of transmission: Secondary | ICD-10-CM

## 2019-12-28 LAB — POCT URINALYSIS DIPSTICK
Bilirubin, UA: NEGATIVE
Blood, UA: NEGATIVE
Glucose, UA: NEGATIVE
Ketones, UA: 5
Leukocytes, UA: NEGATIVE
Nitrite, UA: NEGATIVE
Protein, UA: POSITIVE — AB
Spec Grav, UA: >=1.03 — AB (ref 1.010–1.025)
Urobilinogen, UA: 0.2 E.U./dL
pH, UA: 6 (ref 5.0–8.0)

## 2019-12-28 NOTE — Progress Notes (Signed)
This visit occurred during the SARS-CoV-2 public health emergency.  Safety protocols were in place, including screening questions prior to the visit, additional usage of staff PPE, and extensive cleaning of exam room while observing appropriate contact time as indicated for disinfecting solutions.  STI testing d/w pt.  No FCNAVD.  No new testicle sx.  No discharge or rash.  He wanted peace of mind with testing.  D/w pt.    He has an apartment in [place].  He is separated with divorce pending.  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  He is adjusting to the changes in his life.   Meds, vitals, and allergies reviewed.   ROS: Per HPI unless specifically indicated in ROS section   GEN: nad, alert and oriented HEENT: ncat NECK: supple w/o LA CV: rrr.  PULM: ctab, no inc wob ABD: soft, +bs EXT: no edema SKIN: well perfused.

## 2019-12-28 NOTE — Patient Instructions (Addendum)
Go to the lab on the way out.   If you have mychart we'll likely use that to update you.    Take care.  Glad to see you. 

## 2019-12-29 DIAGNOSIS — Z113 Encounter for screening for infections with a predominantly sexual mode of transmission: Secondary | ICD-10-CM | POA: Insufficient documentation

## 2019-12-29 LAB — HIV ANTIBODY (ROUTINE TESTING W REFLEX): HIV 1&2 Ab, 4th Generation: NONREACTIVE

## 2019-12-29 LAB — RPR: RPR Ser Ql: NONREACTIVE

## 2019-12-29 LAB — C. TRACHOMATIS/N. GONORRHOEAE RNA
C. trachomatis RNA, TMA: NOT DETECTED
N. gonorrhoeae RNA, TMA: NOT DETECTED

## 2019-12-29 NOTE — Assessment & Plan Note (Signed)
Urine micro done at [facility], neg for trichomonas, d/w pt.  Other labs pending.  No symptoms per patient report.  See notes on labs when resulted.  He agrees with plan.

## 2020-07-05 ENCOUNTER — Encounter: Payer: Self-pay | Admitting: Family Medicine

## 2020-07-09 ENCOUNTER — Ambulatory Visit (INDEPENDENT_AMBULATORY_CARE_PROVIDER_SITE_OTHER): Payer: 59

## 2020-07-09 ENCOUNTER — Telehealth: Payer: Self-pay | Admitting: Family Medicine

## 2020-07-09 ENCOUNTER — Ambulatory Visit (HOSPITAL_COMMUNITY)
Admission: EM | Admit: 2020-07-09 | Discharge: 2020-07-09 | Disposition: A | Discharge status: Home / Self Care | Payer: 59 | Attending: Physician Assistant | Admitting: Physician Assistant

## 2020-07-09 ENCOUNTER — Encounter (HOSPITAL_COMMUNITY): Payer: Self-pay | Admitting: Emergency Medicine

## 2020-07-09 ENCOUNTER — Other Ambulatory Visit: Payer: Self-pay

## 2020-07-09 DIAGNOSIS — R059 Cough, unspecified: Secondary | ICD-10-CM

## 2020-07-09 DIAGNOSIS — U071 COVID-19: Secondary | ICD-10-CM | POA: Insufficient documentation

## 2020-07-09 DIAGNOSIS — R030 Elevated blood-pressure reading, without diagnosis of hypertension: Secondary | ICD-10-CM | POA: Insufficient documentation

## 2020-07-09 DIAGNOSIS — R0602 Shortness of breath: Secondary | ICD-10-CM | POA: Insufficient documentation

## 2020-07-09 LAB — COMPREHENSIVE METABOLIC PANEL
ALT: 33 U/L (ref 0–44)
AST: 36 U/L (ref 15–41)
Albumin: 4 g/dL (ref 3.5–5.0)
Alkaline Phosphatase: 51 U/L (ref 38–126)
Anion gap: 9 (ref 5–15)
BUN: 13 mg/dL (ref 6–20)
CO2: 26 mmol/L (ref 22–32)
Calcium: 8.9 mg/dL (ref 8.9–10.3)
Chloride: 103 mmol/L (ref 98–111)
Creatinine, Ser: 0.86 mg/dL (ref 0.61–1.24)
GFR, Estimated: >60 mL/min (ref >60)
Glucose, Bld: 80 mg/dL (ref 70–99)
Potassium: 4.3 mmol/L (ref 3.5–5.1)
Sodium: 138 mmol/L (ref 135–145)
Total Bilirubin: 1.4 mg/dL — ABNORMAL HIGH (ref 0.3–1.2)
Total Protein: 7.3 g/dL (ref 6.5–8.1)

## 2020-07-09 LAB — CBC WITH DIFFERENTIAL/PLATELET
Abs Immature Granulocytes: 0.03 10*3/uL (ref 0.00–0.07)
Basophils Absolute: 0 10*3/uL (ref 0.0–0.1)
Basophils Relative: 0 %
Eosinophils Absolute: 0.2 10*3/uL (ref 0.0–0.5)
Eosinophils Relative: 3 %
HCT: 44.5 % (ref 39.0–52.0)
Hemoglobin: 15.3 g/dL (ref 13.0–17.0)
Immature Granulocytes: 1 %
Lymphocytes Relative: 36 %
Lymphs Abs: 2.3 10*3/uL (ref 0.7–4.0)
MCH: 29.8 pg (ref 26.0–34.0)
MCHC: 34.4 g/dL (ref 30.0–36.0)
MCV: 86.7 fL (ref 80.0–100.0)
Monocytes Absolute: 0.4 10*3/uL (ref 0.1–1.0)
Monocytes Relative: 7 %
Neutro Abs: 3.5 10*3/uL (ref 1.7–7.7)
Neutrophils Relative %: 53 %
Platelets: 199 10*3/uL (ref 150–400)
RBC: 5.13 MIL/uL (ref 4.22–5.81)
RDW: 11.7 % (ref 11.5–15.5)
WBC: 6.5 10*3/uL (ref 4.0–10.5)
nRBC: 0 % (ref 0.0–0.2)

## 2020-07-09 MED ORDER — ALBUTEROL SULFATE HFA 108 (90 BASE) MCG/ACT IN AERS: 1.0000 | INHALATION_SPRAY | Freq: Four times a day (QID) | RESPIRATORY_TRACT | 0 refills | Status: DC | PRN

## 2020-07-09 MED ORDER — PREDNISONE 20 MG PO TABS: 40.0000 mg | ORAL_TABLET | Freq: Every day | ORAL | 0 refills | Status: AC

## 2020-07-09 NOTE — Telephone Encounter (Signed)
I spoke with pt; pt said today has low energy, pt said gets SOB with exertion. Pt returned to work today and having some problem with walking around at work but pt plans on frequent breaks; pt said he has no more vacation time so went back to work a little early. Pt said along with SOB upon exertion pt has wheezing and prod cough with green phlegm. Pulse ox readings between 93 -97%. Pt has sinus congestion and last fever was 07/05/20 and diarrhea last on 07/08/20. Pt is presently in [place] with work. Pt said will leave work at lunchtime and will go to [facility] in [place] or [place]. UC & ED precautions given and pt voiced understanding; sending FYI to Dr Damita Dunnings.

## 2020-07-09 NOTE — ED Provider Notes (Signed)
[place]    CSN: [id number] Arrival date & time: 07/09/20  1220      History   Chief Complaint Chief Complaint  Patient presents with  . covid positive.  sob, fatigue    HPI Ronnie Alexander is a 40 y.o. male.   Patient presents today with a 1 week history of productive cough.  Reports associated shortness of breath that is worse with activity.  Initially symptoms began as URI with sinus pressure/congestion but the symptoms have improved and now he does have a productive cough and shortness of breath.  He also reports significant fatigue as he is often falling asleep but typically is a very energetic person.  He tested positive for COVID-19 on 07/02/2020.  He contacted his PCP regarding ongoing symptoms recommended evaluation by our clinic.  He denies any recent antibiotic use.  He has had COVID-19 vaccinations but has not had booster.  He was not given any antivirals for COVID-19.  He denies history of asthma, COPD, allergies.  He is a former smoker but quit many years ago.  He is having difficulty with daily activities as a result of symptoms.  He is requesting chest x-ray at his PCPs request.     Past Medical History:  Diagnosis Date  . 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 Kidney stones   . Migraines    with aura, throbbing pain, with photo/phonophobia    Patient Active Problem List   Diagnosis Date Noted  . Screen for STD (sexually transmitted disease) 12/29/2019  . Adjustment disorder with anxious mood 01/06/2019  . Other social stressor 12/04/2018  . Routine general medical examination at a health care facility 10/20/2017  . Advance care planning 10/20/2017  . Caregiver stress 10/20/2017  . Acute non-recurrent sinusitis 03/29/2015  . Muscle pain 01/27/2015  . HTN (hypertension) 10/02/2014  . Skin macule 10/02/2014  . Renal stones 05/11/2012  . Pain in joint, ankle and foot 05/11/2012  . Rhinitis 12/04/2011  . Cleft palate 06/28/2011  . Migraine  with aura 06/28/2011  . Varicose vein 06/28/2011    Past Surgical History:  Procedure Laterality Date  . CLEFT PALATE REPAIR     17 total procedures  . FRACTURE SURGERY     L tibia facture       Home Medications    Prior to Admission medications   Medication Sig Start Date End Date Taking? Authorizing Provider  acetaminophen (TYLENOL) 325 MG tablet Take 650 mg by mouth every 6 (six) hours as needed.   Yes [provider]  albuterol (VENTOLIN HFA) 108 (90 Base) MCG/ACT inhaler Inhale 1-2 puffs into the lungs every 6 (six) hours as needed for wheezing or shortness of breath. 07/09/20  Yes Ilene Witcher, Derry Skill, PA-C  dextromethorphan-guaiFENesin (MUCINEX DM) 30-600 MG 12hr tablet Take 1 tablet by mouth 2 (two) times daily.   Yes [provider]  predniSONE (DELTASONE) 20 MG tablet Take 2 tablets (40 mg total) by mouth daily with breakfast for 4 days. 07/09/20 07/13/20 Yes Careena Degraffenreid K, PA-C  Pseudoephedrine-APAP-DM (DAYQUIL PO) Take by mouth.   Yes [provider]  ibuprofen (ADVIL,MOTRIN) 600 MG tablet Take 1 tablet (600 mg total) by mouth every 6 (six) hours as needed. 05/07/17   Melynda Ripple, MD  tadalafil (CIALIS) 5 MG tablet Take 5 mg by mouth daily.    [provider]    Family History Family History  Problem Relation Age of Onset  .  Alcohol abuse Mother   . Hyperlipidemia Mother   . Depression Mother   . Bipolar disorder Mother   . COPD Maternal Grandfather   . Colon cancer Neg Hx   . Prostate cancer Neg Hx     Social History Social History   Tobacco Use  . Smoking status: Former Smoker    Packs/day: 1.00    Years: 14.00    Pack years: 14.00    Types: Cigarettes    Quit date: 08/29/2010    Years since quitting: 9.8  . Smokeless tobacco: Never Used  Vaping Use  . Vaping Use: Every day  . Substances: Nicotine  Substance Use Topics  . Alcohol use: Yes    Alcohol/week: 2.0 standard drinks    Types: 2 Glasses of wine per week     Comment: rare  . Drug use: No     Allergies   Citalopram and Effexor [venlafaxine]   Review of Systems Review of Systems  Constitutional: Positive for activity change and fatigue. Negative for appetite change and fever.  HENT: Positive for sinus pressure. Negative for congestion, sneezing and sore throat.   Respiratory: Positive for cough and shortness of breath.   Cardiovascular: Negative for chest pain.  Gastrointestinal: Negative for abdominal pain, diarrhea, nausea and vomiting.  Neurological: Negative for dizziness, light-headedness and headaches.     Physical Exam Triage Vital Signs ED Triage Vitals [07/09/20 1326]  Enc Vitals Group     BP      Pulse      Resp      Temp      Temp src      SpO2      Weight      Height      Head Circumference      Peak Flow      Pain Score 0     Pain Loc      Pain Edu?      Excl. in GC?    No data found.  Updated Vital Signs BP (!) 149/102 (BP Location: Right Arm)   Pulse 70   Temp 97.9 F (36.6 C) (Oral)   Resp 20   SpO2 98%   Visual Acuity Right Eye Distance:   Left Eye Distance:   Bilateral Distance:    Right Eye Near:   Left Eye Near:    Bilateral Near:     Physical Exam Vitals reviewed.  Constitutional:      General: He is awake.     Appearance: Normal appearance. He is normal weight. He is not ill-appearing.     Comments: Very pleasant male appears stated age no acute distress  HENT:     Head: Normocephalic and atraumatic.     Right Ear: Tympanic membrane, ear canal and external ear normal. Tympanic membrane is not erythematous or bulging.     Left Ear: Ear canal and external ear normal. 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     Nose: Nose normal.     Mouth/Throat:     Pharynx: Uvula midline. No oropharyngeal exudate or posterior oropharyngeal erythema.     Tonsils: 1+ on the right. 1+ on the left.  Cardiovascular:     Rate and Rhythm: Normal rate and regular rhythm.     Heart sounds: No murmur  heard.   Pulmonary:     Effort: Pulmonary effort is normal. No accessory muscle usage or respiratory distress.     Breath sounds: No stridor. Wheezing present. No rhonchi or rales.  Comments: Scattered wheezes throughout lung fields Abdominal:     General: Bowel sounds are normal.     Palpations: Abdomen is soft.     Tenderness: There is no abdominal tenderness.  Lymphadenopathy:     Head:     Right side of head: No submental, submandibular or tonsillar adenopathy.     Left side of head: No submental, submandibular or tonsillar adenopathy.     Cervical: No cervical adenopathy.  Neurological:     Mental Status: He is alert.  Psychiatric:        Behavior: Behavior is cooperative.      UC Treatments / Results  Labs (all labs ordered are listed, but only abnormal results are displayed) Labs Reviewed  CBC WITH DIFFERENTIAL/PLATELET  COMPREHENSIVE METABOLIC PANEL    EKG   Radiology DG Chest 2 View  Result Date: 07/09/2020 CLINICAL DATA:  Cough and short of breath EXAM: CHEST - 2 VIEW COMPARISON:  None. FINDINGS: Normal mediastinum and cardiac silhouette. Normal pulmonary vasculature. No evidence of effusion, infiltrate, or pneumothorax. LEFT suprahilar peribronchial nodularity identified on 1 AP view was not present on the second AP view and favored benign vascular shadow. No acute bony abnormality. IMPRESSION: No acute cardiopulmonary process. Electronically Signed   By: Suzy Bouchard M.D.   On: 07/09/2020 14:23    Procedures Procedures (including critical care time)  Medications Ordered in UC Medications - No data to display  Initial Impression / Assessment and Plan / UC Course  I have reviewed the triage vital signs and the nursing notes.  Pertinent labs & imaging results that were available during my care of the patient were reviewed by me and considered in my medical decision making (see chart for details).     Chest x-ray obtained showed no acute  abnormality.  He is outside of the window of effectiveness for antivirals.  No evidence of acute infection on exam that warrant initiation of antibiotics.  Patient was prescribed prednisone burst with instruction not to take NSAIDs.  He was provided albuterol inhaler with instruction on how to properly use this medication during visit today.  His blood pressure was elevated so he was instructed to avoid decongestants and monitor this at home.  If remains elevated he is to follow-up with PCP.  Discussed that if he has any chest pain, shortness of breath, headache, visual disturbance he is to go to the ER.  He is to rest and drink plenty of fluid.  Strict return precautions given to which patient expressed understanding.  Final Clinical Impressions(s) / UC Diagnoses   Final diagnoses:  COVID-19  Shortness of breath  Cough  Elevated blood pressure reading     Discharge Instructions     Take 40 mg prednisone daily for 4 days.  Do not take NSAIDs (aspirin, ibuprofen/Advil, naproxen/Aleve) with this medication.  Use albuterol every 4-6 hours as needed.  Avoid decongestants as her blood pressure was elevated.  Please monitor your blood pressure and if this remains elevated follow-up with your PCP.  Your chest x-ray was normal we will be in touch with your lab results soon as we have them.  If you have any worsening symptoms you need to be reevaluated.  Follow-up with PCP within 1 week.    ED Prescriptions    Medication Sig Dispense Auth. Provider   albuterol (VENTOLIN HFA) 108 (90 Base) MCG/ACT inhaler Inhale 1-2 puffs into the lungs every 6 (six) hours as needed for wheezing or shortness of breath. 8 g  Thamar Holik K, PA-C   predniSONE (DELTASONE) 20 MG tablet Take 2 tablets (40 mg total) by mouth daily with breakfast for 4 days. 8 tablet Shaune Westfall, Derry Skill, PA-C     PDMP not reviewed this encounter.   Terrilee Croak, PA-C 07/09/20 1451

## 2020-07-09 NOTE — ED Triage Notes (Signed)
Patient diagnosed with covid 07/02/2020.  Patient spoke to his pcp with complaints of sob, fatigue.  pcp instructed patient to come to ucc for chest xray

## 2020-07-09 NOTE — Telephone Encounter (Signed)
Jul 05, 2020  Doyle Askew to Tonia Ghent, MD      6:14 PM Hello Dr. Ross Marcus time no see. I hope you are well. I'm Doing pretty good keeping it between the lines these days. I am fully vaccinated but no booster for Covid. Well. Monday of this week I tested positive. It has taken me for a ride. I've been taking mucinex day time and night time since. I have kept the 4 hour dosing times. If I miss a couple hours I am in trouble. I am able to breathe pretty good sitting down but the second I get up and try to do basic things I am exhausted. Sorry it is so late in the week and Friday evening at that. I apologize. I am keeping up with my 02 saturation it is staying around 95-97% resting. Is there anything I can do to help with this other than run it's course? A nurse friend of mine said I should ask if an inhaler is an option. I am pretty desperate at this point. I did a home test Monday and went to the HD on Wednesday. Attached are the results.  Thank you Doc.

## 2020-07-09 NOTE — Telephone Encounter (Signed)
Please triage patient re: covid sx.  It looks like his mychart message came into the clinic after hours on Friday.  Thanks.

## 2020-07-09 NOTE — Telephone Encounter (Signed)
Noted. Agreed.  Thanks. Will await UC notes.

## 2020-07-09 NOTE — Discharge Instructions (Signed)
Take 40 mg prednisone daily for 4 days.  Do not take NSAIDs (aspirin, ibuprofen/Advil, naproxen/Aleve) with this medication.  Use albuterol every 4-6 hours as needed.  Avoid decongestants as her blood pressure was elevated.  Please monitor your blood pressure and if this remains elevated follow-up with your PCP.  Your chest x-ray was normal we will be in touch with your lab results soon as we have them.  If you have any worsening symptoms you need to be reevaluated.  Follow-up with PCP within 1 week.

## 2020-08-02 ENCOUNTER — Telehealth: Payer: Self-pay

## 2020-08-02 DIAGNOSIS — R4586 Emotional lability: Secondary | ICD-10-CM

## 2020-08-02 NOTE — Telephone Encounter (Signed)
I put in the referral to counseling.  Please check with patient to make sure there isn't an urgent issue.  Thanks.

## 2020-08-02 NOTE — Telephone Encounter (Signed)
Left message on VM per DPR. Advised pt if he needed anything prior to the appointment to let us know or seek medical attention. I did mention the [facility] "urgent care" in [place].

## 2020-08-02 NOTE — Telephone Encounter (Signed)
Patient called stating he would like a referral to therapist for his mental health. He has talked to the doctor about this before and would like to proceed with this now. He lives in [place], he is aware that it may take a couple of months to get in with someone. He is ok with going to [facility] and is ok with waiting a couple months or so if need to. Please review. Patient is not in distress.

## 2020-09-04 ENCOUNTER — Ambulatory Visit: Payer: Self-pay

## 2020-09-06 ENCOUNTER — Ambulatory Visit (INDEPENDENT_AMBULATORY_CARE_PROVIDER_SITE_OTHER): Payer: 59 | Admitting: Psychology

## 2020-09-06 DIAGNOSIS — F4323 Adjustment disorder with mixed anxiety and depressed mood: Secondary | ICD-10-CM

## 2020-09-20 ENCOUNTER — Ambulatory Visit (INDEPENDENT_AMBULATORY_CARE_PROVIDER_SITE_OTHER): Payer: 59 | Admitting: Psychology

## 2020-09-20 DIAGNOSIS — F4323 Adjustment disorder with mixed anxiety and depressed mood: Secondary | ICD-10-CM

## 2020-10-03 ENCOUNTER — Ambulatory Visit (INDEPENDENT_AMBULATORY_CARE_PROVIDER_SITE_OTHER): Payer: 59 | Admitting: Psychology

## 2020-10-03 DIAGNOSIS — F4323 Adjustment disorder with mixed anxiety and depressed mood: Secondary | ICD-10-CM

## 2020-10-25 ENCOUNTER — Ambulatory Visit (INDEPENDENT_AMBULATORY_CARE_PROVIDER_SITE_OTHER): Payer: 59 | Admitting: Psychology

## 2020-10-25 DIAGNOSIS — F4323 Adjustment disorder with mixed anxiety and depressed mood: Secondary | ICD-10-CM

## 2020-11-15 ENCOUNTER — Ambulatory Visit (INDEPENDENT_AMBULATORY_CARE_PROVIDER_SITE_OTHER): Payer: 59 | Admitting: Psychology

## 2020-11-15 DIAGNOSIS — F4323 Adjustment disorder with mixed anxiety and depressed mood: Secondary | ICD-10-CM | POA: Diagnosis not present

## 2020-11-28 ENCOUNTER — Ambulatory Visit (INDEPENDENT_AMBULATORY_CARE_PROVIDER_SITE_OTHER): Payer: 59 | Admitting: Psychology

## 2020-11-28 DIAGNOSIS — F4323 Adjustment disorder with mixed anxiety and depressed mood: Secondary | ICD-10-CM | POA: Diagnosis not present

## 2020-12-27 ENCOUNTER — Ambulatory Visit: Payer: 59 | Admitting: Psychology

## 2021-10-21 ENCOUNTER — Encounter: Payer: Self-pay | Admitting: Family Medicine

## 2021-10-21 ENCOUNTER — Ambulatory Visit (INDEPENDENT_AMBULATORY_CARE_PROVIDER_SITE_OTHER): Payer: 59 | Admitting: Family Medicine

## 2021-10-21 VITALS — BP 160/100 | HR 97 | Temp 97.2°F | Resp 16 | Ht 71.0 in | Wt 224.4 lb

## 2021-10-21 DIAGNOSIS — Z3009 Encounter for other general counseling and advice on contraception: Secondary | ICD-10-CM

## 2021-10-21 DIAGNOSIS — Z7189 Other specified counseling: Secondary | ICD-10-CM

## 2021-10-21 DIAGNOSIS — F4322 Adjustment disorder with anxiety: Secondary | ICD-10-CM

## 2021-10-21 DIAGNOSIS — I10 Essential (primary) hypertension: Secondary | ICD-10-CM

## 2021-10-21 DIAGNOSIS — R030 Elevated blood-pressure reading, without diagnosis of hypertension: Secondary | ICD-10-CM | POA: Diagnosis not present

## 2021-10-21 DIAGNOSIS — Z0000 Encounter for general adult medical examination without abnormal findings: Secondary | ICD-10-CM | POA: Diagnosis not present

## 2021-10-21 DIAGNOSIS — J019 Acute sinusitis, unspecified: Secondary | ICD-10-CM

## 2021-10-21 MED ORDER — AMOXICILLIN-POT CLAVULANATE 875-125 MG PO TABS: 1.0000 | ORAL_TABLET | Freq: Two times a day (BID) | ORAL | 0 refills | Status: DC

## 2021-10-21 MED ORDER — HYDROCHLOROTHIAZIDE 12.5 MG PO TABS: 12.5000 mg | ORAL_TABLET | Freq: Every day | ORAL | 3 refills | Status: DC

## 2021-10-21 MED ORDER — HYDROXYZINE HCL 10 MG PO TABS: 10.0000 mg | ORAL_TABLET | Freq: Three times a day (TID) | ORAL | 1 refills | Status: DC | PRN

## 2021-10-21 NOTE — Progress Notes (Unsigned)
CPE- See plan.  Routine anticipatory guidance given to patient.  See health maintenance.  The possibility exists that previously documented standard health maintenance information may have been brought forward from a previous encounter into this note.  If needed, that same information has been updated to reflect the current situation based on today's encounter.    Tetanus 2019 Flu encouraged. PNA and shingles not due.  Covid prev done.   Colon and prostate cancer screening not due.  Living will d/w pt.  Ronnie Alexander designated if patient were incapacitated.   Diet and exercise d/w pt.   BP elevated.  Remarried in May of this year.  He is short staffed at work, work stressors d/w pt.  He has split custody of his daughter.  We talked about prn med use for anxiety.  No SI/HI.    No recent migraines or renal stones.    He asked about checking a testosterone level.  Fatigue noted.   Labs pending.   H/o cleft palate.  H/o presumed sinus infection over the last month.  Nasal discharge, some cough with occ sputum.  Nasal congestion.  Occ epistaxis.  Using nasal saline and occ nasal saline gel.  B maxillary pain.  No fevers.  L ear pain noted.    PMH and SH reviewed  Meds, vitals, and allergies reviewed.   ROS: Per HPI.  Unless specifically indicated otherwise in HPI, the patient denies:  General: fever. Eyes: acute vision changes ENT: sore throat Cardiovascular: chest pain Respiratory: SOB GI: vomiting GU: dysuria Musculoskeletal: acute back pain Derm: acute rash Neuro: acute motor dysfunction Psych: worsening mood Endocrine: polydipsia Heme: bleeding Allergy: hayfever  GEN: nad, alert and oriented HEENT: ncat, L ear TM perf at baseline.  Nasal exam stuffy, OP irritation posterior NECK: supple w/o LA CV: rrr. PULM: ctab, no inc wob ABD: soft, +bs EXT: no edema SKIN: no acute rash

## 2021-10-21 NOTE — Patient Instructions (Addendum)
Go to the lab on the way out.   If you have mychart we'll likely use that to update you.    Take care.  Glad to see you. You could try hydroxyzine if needed.  Sedation caution.   Augmentin for presumed sinus infection.  HCTZ 12.'5mg'$  a day.  Update me about your BP next week.  We'll call about seeing urology.

## 2021-10-22 DIAGNOSIS — Z3009 Encounter for other general counseling and advice on contraception: Secondary | ICD-10-CM | POA: Insufficient documentation

## 2021-10-22 LAB — CBC WITH DIFFERENTIAL/PLATELET
Basophils Absolute: 0 10*3/uL (ref 0.0–0.1)
Basophils Relative: 0.5 % (ref 0.0–3.0)
Eosinophils Absolute: 0.4 10*3/uL (ref 0.0–0.7)
Eosinophils Relative: 5 % (ref 0.0–5.0)
HCT: 42.3 % (ref 39.0–52.0)
Hemoglobin: 14.5 g/dL (ref 13.0–17.0)
Lymphocytes Relative: 27.5 % (ref 12.0–46.0)
Lymphs Abs: 2.1 10*3/uL (ref 0.7–4.0)
MCHC: 34.3 g/dL (ref 30.0–36.0)
MCV: 87.3 fl (ref 78.0–100.0)
Monocytes Absolute: 0.4 10*3/uL (ref 0.1–1.0)
Monocytes Relative: 5.7 % (ref 3.0–12.0)
Neutro Abs: 4.6 10*3/uL (ref 1.4–7.7)
Neutrophils Relative %: 61.3 % (ref 43.0–77.0)
Platelets: 251 10*3/uL (ref 150.0–400.0)
RBC: 4.85 Mil/uL (ref 4.22–5.81)
RDW: 12.9 % (ref 11.5–15.5)
WBC: 7.5 10*3/uL (ref 4.0–10.5)

## 2021-10-22 LAB — LIPID PANEL
Cholesterol: 233 mg/dL — ABNORMAL HIGH (ref 0–200)
HDL: 49.1 mg/dL (ref >39.00)
NonHDL: 183.54
Total CHOL/HDL Ratio: 5
Triglycerides: 297 mg/dL — ABNORMAL HIGH (ref 0.0–149.0)
VLDL: 59.4 mg/dL — ABNORMAL HIGH (ref 0.0–40.0)

## 2021-10-22 LAB — COMPREHENSIVE METABOLIC PANEL
ALT: 44 U/L (ref 0–53)
AST: 32 U/L (ref 0–37)
Albumin: 4.4 g/dL (ref 3.5–5.2)
Alkaline Phosphatase: 62 U/L (ref 39–117)
BUN: 12 mg/dL (ref 6–23)
CO2: 27 mEq/L (ref 19–32)
Calcium: 9.3 mg/dL (ref 8.4–10.5)
Chloride: 102 mEq/L (ref 96–112)
Creatinine, Ser: 0.96 mg/dL (ref 0.40–1.50)
GFR: 98.06 mL/min (ref >60.00)
Glucose, Bld: 88 mg/dL (ref 70–99)
Potassium: 4.1 mEq/L (ref 3.5–5.1)
Sodium: 138 mEq/L (ref 135–145)
Total Bilirubin: 0.5 mg/dL (ref 0.2–1.2)
Total Protein: 7.3 g/dL (ref 6.0–8.3)

## 2021-10-22 LAB — TSH: TSH: 1.03 u[IU]/mL (ref 0.35–5.50)

## 2021-10-22 LAB — LDL CHOLESTEROL, DIRECT: Direct LDL: 142 mg/dL

## 2021-10-22 NOTE — Assessment & Plan Note (Signed)
Tetanus 2019 Flu encouraged. PNA and shingles not due.  Covid prev done.   Colon and prostate cancer screening not due.  Living will d/w pt.  Ronnie Alexander designated if patient were incapacitated.   Diet and exercise d/w pt.

## 2021-10-22 NOTE — Assessment & Plan Note (Signed)
Likely, start Augmentin.  Routine cautions given to patient.  Update me as needed.  Okay for outpatient follow-up.  He agrees to plan.

## 2021-10-22 NOTE — Assessment & Plan Note (Signed)
Living will d/w pt.  Ronnie Alexander designated if patient were incapacitated.

## 2021-10-22 NOTE — Assessment & Plan Note (Signed)
Discussed using hydroxyzine as needed with routine sedation caution.  Okay for outpatient follow-up.  He agrees to plan.

## 2021-10-22 NOTE — Assessment & Plan Note (Addendum)
He asked about referral for vasectomy evaluation.  Referral placed.

## 2021-10-22 NOTE — Assessment & Plan Note (Signed)
Restart hydrochlorothiazide routine cautions.  See notes on labs.  He can update me about his blood pressure next week.

## 2021-10-28 ENCOUNTER — Encounter: Payer: Self-pay | Admitting: Urology

## 2021-10-28 ENCOUNTER — Ambulatory Visit (INDEPENDENT_AMBULATORY_CARE_PROVIDER_SITE_OTHER): Payer: 59 | Admitting: Urology

## 2021-10-28 ENCOUNTER — Encounter: Payer: Self-pay | Admitting: Family Medicine

## 2021-10-28 VITALS — BP 150/92 | HR 92 | Ht 71.0 in | Wt 224.0 lb

## 2021-10-28 DIAGNOSIS — Z3009 Encounter for other general counseling and advice on contraception: Secondary | ICD-10-CM | POA: Diagnosis not present

## 2021-10-28 MED ORDER — DIAZEPAM 10 MG PO TABS: 10.0000 mg | ORAL_TABLET | Freq: Once | ORAL | 0 refills | Status: AC

## 2021-10-28 NOTE — Progress Notes (Signed)
10/28/2021 4:41 PM   Ronnie Alexander 1980-08-14 [id number]  Referring provider: Tonia Ghent, MD [address] [place],  [place] [address]  Chief Complaint  Patient presents with   VAS Consult    HPI: 41 y.o. year old male referred for further evaluation of possible vasectomy.  He denies a history of testicular trauma or pain.  No urinary issues.  No previous scrotal surgeries.  He has 1 biological child as well as a stepchild.  He and his wife are on the same page.  She is interested in having her IUD removed.   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 If you have any questions, ask your nurse or doctor.          acetaminophen 325 MG tablet Commonly known as: TYLENOL Take 650 mg by mouth every 6 (six) hours as needed.   albuterol 108 (90 Base) MCG/ACT inhaler Commonly known as: VENTOLIN HFA Inhale 1-2 puffs into the lungs every 6 (six) hours as needed for wheezing or shortness of breath.   amoxicillin-clavulanate 875-125 MG tablet Commonly known as: AUGMENTIN Take 1 tablet by mouth 2 (two) times daily.   hydrochlorothiazide 12.5 MG tablet Commonly known as: HYDRODIURIL Take 1 tablet (12.5 mg total) by mouth daily.   hydrOXYzine 10 MG tablet Commonly known as: ATARAX Take 1 tablet (10 mg total) by mouth 3 (three) times daily as needed for anxiety.   ibuprofen 600 MG tablet Commonly known as: ADVIL Take 1 tablet  (600 mg total) by mouth every 6 (six) hours as needed.   tadalafil 5 MG tablet Commonly known as: CIALIS Take 5 mg by mouth daily.   ZyrTEC Allergy 10 MG tablet Generic drug: cetirizine        Allergies:  Allergies  Allergen Reactions   Citalopram     At dose of '20mg'$  or higher- lack of ejaculation   Effexor [Venlafaxine]     intolerant    Family History: Family History  Problem Relation Age of Onset   Alcohol abuse Mother    Hyperlipidemia Mother    Depression Mother    Bipolar disorder Mother    COPD Maternal Grandfather    Colon cancer Neg Hx    Prostate cancer Neg Hx     Social History:  reports that he quit smoking about 11 years ago. His smoking use included cigarettes. He has a 14.00 pack-year smoking history. He has never used smokeless tobacco. He reports current alcohol use of about 2.0 standard drinks of alcohol per week. He reports that he does not use drugs.   Physical Exam: BP (!) 150/92   Pulse 92   Ht '5\' 11"'$  (1.803 m)   Wt 224 lb (101.6 kg)  BMI 31.24 kg/m   Constitutional:  Alert and oriented, No acute distress. HEENT: [place] AT, moist mucus membranes.  Trachea midline, no masses. Cardiovascular: No clubbing, cyanosis, or edema. Respiratory: Normal respiratory effort, no increased work of breathing. GI: Abdomen is soft, nontender, nondistended, no abdominal masses GU: Normal phallus.  Bilateral descended testicles without masses.  Vasa easily palpable bilaterally. Skin: No rashes, bruises or suspicious lesions. Neurologic: Grossly intact, no focal deficits, moving all 4 extremities. Psychiatric: Normal mood and affect.   Assessment & Plan:    1. Vasectomy evaluation Today, we discussed what the vas deferens is, where it is located, and its function. We reviewed the procedure for vasectomy, it's risks, benefits, alternatives, and likelihood of achieving his goals. We discussed in detail the procedure, complications, and recovery as well as the  need for clearance prior to unprotected intercourse. We discussed that vasectomy does not protect against sexually transmitted diseases. We discussed that this procedure does not result in immediate sterility and that they would need to use other forms of birth control until he has been cleared with negative postvasectomy semen analyses. I explained that the procedure is considered to be permanent and that attempts at reversal have varying degrees of success. These options include vasectomy reversal, sperm retrieval, and in vitro fertilization; these can be very expensive. We discussed the chance of postvasectomy pain syndrome which occurs in less than 5% of patients. I explained to the patient that there is no treatment to resolve this chronic pain, and that if it developed I would not be able to help resolve the issue, but that surgery is generally not needed for correction. I explained there have even been reports of systemic like illness associated with this chronic pain, and that there was no good cure. I explained that vasectomy it is not a 100% reliable form of birth control, and the risk of pregnancy after vasectomy is approximately 1 in 2000 men who had a negative postvasectomy semen analysis or rare non-motile sperm. I explained that repeat vasectomy was necessary in less than 1% of vasectomy procedures when employing the type of technique that I use. I explained that he should refrain from ejaculation for approximately one week following vasectomy. I explained that there are other options for birth control which are permanent and non-permanent; we discussed these. I explained the rates of surgical complications, such as symptomatic hematoma or infection, are low (1-2%) and vary with the surgeon's experience and criteria used to diagnose the complication.   The patient had the opportunity to ask questions to his stated satisfaction. He voiced understanding of the above factors and stated that he has read  all the information provided to him and the packets and informed consent.  He is interested in receiving of Valium 10 mg prior to the procedure for the purpose of anxiolysis.  A prescription was given today.  He will have a driver on the day of the procedure.     [name], MD  [facility] [address] [place], [place] [address] [phone]

## 2021-10-28 NOTE — Patient Instructions (Signed)

## 2021-10-29 ENCOUNTER — Other Ambulatory Visit: Payer: Self-pay | Admitting: Family Medicine

## 2021-10-29 DIAGNOSIS — I10 Essential (primary) hypertension: Secondary | ICD-10-CM

## 2021-10-29 MED ORDER — HYDROCHLOROTHIAZIDE 12.5 MG PO TABS: 25.0000 mg | ORAL_TABLET | Freq: Every day | ORAL | Status: DC

## 2021-11-20 NOTE — Progress Notes (Signed)
11/21/21  CC:  Chief Complaint  Patient presents with   VAS    HPI: 41 year old male who presents today for vasectomy.   NED. A&Ox3.   No respiratory distress   Abd soft, NT, ND Normal external genitalia with patent urethral meatus  A timeout was performed.  Patient's identity and consent was confirmed.  All questions were answered.   Bilateral Vasectomy Procedure  Pre-Procedure: - Patient's scrotum was prepped and draped for vasectomy. - The vas was palpated through the scrotal skin on the left. - 1% Xylocaine was injected into the skin and surrounding tissue for placement  - In a similar manner, the vas on the right was identified, anesthetized, and stabilized.  Procedure: - A #11 blade was used to make a small stab incision in the skin overlying the vas - The left vas was isolated and brought up through the incision exposing that structure. - Bleeding points were cauterized as they occurred. - The vas was free from the surrounding structures and brought to the view. - A segment was positioned for placement with a hemostat. - A second hemostat was placed and a small segment between the two hemostats and was removed for inspection. - Each end of the transected vas lumen was fulgurated/ obliterated using needlepoint electrocautery -A fascial interposition was performed on testicular end of the vas using #3-0 chromic suture -The same procedure was performed on the right. - A single suture of #3-0 chromic catgut was used to close each lateral scrotal skin incision - A dressing was applied.  Post-Procedure: - Patient was instructed in care of the operative area - A specimen is to be delivered in 12 weeks   -Another form of contraception is to be used until post vasectomy semen analysis  [name], MD

## 2021-11-21 ENCOUNTER — Encounter: Payer: Self-pay | Admitting: Urology

## 2021-11-21 ENCOUNTER — Ambulatory Visit (INDEPENDENT_AMBULATORY_CARE_PROVIDER_SITE_OTHER): Payer: 59 | Admitting: Urology

## 2021-11-21 VITALS — BP 138/89 | HR 84 | Ht 71.0 in | Wt 224.0 lb

## 2021-11-21 DIAGNOSIS — Z302 Encounter for sterilization: Secondary | ICD-10-CM

## 2021-11-21 NOTE — Patient Instructions (Signed)

## 2021-11-25 ENCOUNTER — Telehealth: Payer: Self-pay | Admitting: Family Medicine

## 2021-11-25 NOTE — Telephone Encounter (Signed)
Patient called and states he has 1 tiny spot at the incision site from Vasectomy done on Friday that wants to bleed. He states it's just a drop here and there and wants to know when it should stop. He states the site looks good and he is not having any problems. I informed him to try liquid band aid on the spot to see if that will take care of the bleeding. Patient voiced understanding.

## 2022-02-20 ENCOUNTER — Other Ambulatory Visit: Payer: 59

## 2022-02-20 DIAGNOSIS — Z302 Encounter for sterilization: Secondary | ICD-10-CM

## 2022-02-21 ENCOUNTER — Ambulatory Visit
Admission: EM | Admit: 2022-02-21 | Discharge: 2022-02-21 | Disposition: A | Discharge status: Home / Self Care | Payer: 59 | Attending: Emergency Medicine | Admitting: Emergency Medicine

## 2022-02-21 DIAGNOSIS — S61412A Laceration without foreign body of left hand, initial encounter: Secondary | ICD-10-CM | POA: Diagnosis not present

## 2022-02-21 DIAGNOSIS — I10 Essential (primary) hypertension: Secondary | ICD-10-CM | POA: Diagnosis not present

## 2022-02-21 DIAGNOSIS — R030 Elevated blood-pressure reading, without diagnosis of hypertension: Secondary | ICD-10-CM

## 2022-02-21 HISTORY — DX: Essential (primary) hypertension: I10

## 2022-02-21 NOTE — ED Provider Notes (Signed)
Ronnie Alexander    CSN: [id number] Arrival date & time: 02/21/22  1224      History   Chief Complaint Chief Complaint  Patient presents with   Laceration    HPI Ronnie Alexander is a 41 y.o. male.  Patient presents with a laceration on his left palm that occurred today when he accidentally cut it on a sharp object in his shed.  Bleeding controlled with direct pressure and liquid bandage.  He denies numbness, weakness, paresthesias, or other symptoms.  Last tetanus 2019.  His medical history includes hypertension.  The history is provided by the patient and medical records.    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   Hypertension    Kidney stones    Migraines    with aura, throbbing pain, with photo/phonophobia    Patient Active Problem List   Diagnosis Date Noted   Vasectomy evaluation 10/22/2021   Screen for STD (sexually transmitted disease) 12/29/2019   Adjustment disorder with anxious mood 01/06/2019   Other social stressor 12/04/2018   Routine general medical examination at a health care facility 10/20/2017   Advance care planning 10/20/2017   Caregiver stress 10/20/2017   Acute non-recurrent sinusitis 03/29/2015   Muscle pain 01/27/2015   HTN (hypertension) 10/02/2014   Skin macule 10/02/2014   Renal stones 05/11/2012   Pain in joint, ankle and foot 05/11/2012   Rhinitis 12/04/2011   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 Authorizing Provider  acetaminophen (TYLENOL) 325 MG tablet Take 650 mg by mouth every 6 (six) hours as needed.    [provider]  albuterol (VENTOLIN HFA) 108 (90 Base) MCG/ACT inhaler  Inhale 1-2 puffs into the lungs every 6 (six) hours as needed for wheezing or shortness of breath. 07/09/20   Raspet, Derry Skill, PA-C  cetirizine (ZYRTEC ALLERGY) 10 MG tablet     [provider]  hydrochlorothiazide (HYDRODIURIL) 12.5 MG tablet Take 2 tablets (25 mg total) by mouth daily. 10/29/21   Tonia Ghent, MD  hydrOXYzine (ATARAX) 10 MG tablet Take 1 tablet (10 mg total) by mouth 3 (three) times daily as needed for anxiety. 10/21/21   Tonia Ghent, MD  ibuprofen (ADVIL,MOTRIN) 600 MG tablet Take 1 tablet (600 mg total) by mouth every 6 (six) hours as needed. 05/07/17   Melynda Ripple, MD  tadalafil (CIALIS) 5 MG tablet Take 5 mg by mouth daily.    [provider]    Family History Family History  Problem Relation Age of Onset   Alcohol abuse Mother    Hyperlipidemia Mother    Depression Mother    Bipolar disorder Mother    COPD Maternal Grandfather    Colon cancer Neg Hx    Prostate cancer Neg Hx     Social History Social History   Tobacco Use   Smoking status: Former    Packs/day: 1.00    Years: 14.00    Total pack years: 14.00    Types: Cigarettes    Quit date: 08/29/2010    Years since  quitting: 11.4    Passive exposure: Past   Smokeless tobacco: Never  Vaping Use   Vaping Use: Every day   Substances: Nicotine  Substance Use Topics   Alcohol use: Yes    Alcohol/week: 2.0 standard drinks of alcohol    Types: 2 Glasses of wine per week    Comment: rare   Drug use: No     Allergies   Citalopram and Effexor [venlafaxine]   Review of Systems Review of Systems  Constitutional:  Negative for chills and fever.  Skin:  Positive for wound. Negative for color change.  Neurological:  Negative for weakness and numbness.  All other systems reviewed and are negative.    Physical Exam Triage Vital Signs ED Triage Vitals  Enc Vitals Group     BP 02/21/22 1410 (!) 168/111     Pulse Rate 02/21/22 1410 88     Resp 02/21/22 1410 18     Temp  02/21/22 1410 97.7 F (36.5 C)     Temp src --      SpO2 02/21/22 1410 97 %     Weight 02/21/22 1408 228 lb (103.4 kg)     Height 02/21/22 1408 6' (1.829 m)     Head Circumference --      Peak Flow --      Pain Score 02/21/22 1408 5     Pain Loc --      Pain Edu? --      Excl. in [place]? --    No data found.  Updated Vital Signs BP (!) 156/100   Pulse 88   Temp 97.7 F (36.5 C)   Resp 18   Ht 6' (1.829 m)   Wt 228 lb (103.4 kg)   SpO2 97%   BMI 30.92 kg/m   Visual Acuity Right Eye Distance:   Left Eye Distance:   Bilateral Distance:    Right Eye Near:   Left Eye Near:    Bilateral Near:     Physical Exam Vitals and nursing note reviewed.  Constitutional:      General: He is not in acute distress.    Appearance: Normal appearance. He is well-developed. He is not ill-appearing.  HENT:     Mouth/Throat:     Mouth: Mucous membranes are moist.  Cardiovascular:     Rate and Rhythm: Normal rate and regular rhythm.     Heart sounds: Normal heart sounds.  Pulmonary:     Effort: Pulmonary effort is normal. No respiratory distress.     Breath sounds: Normal breath sounds.  Musculoskeletal:        General: No swelling or deformity. Normal range of motion.     Cervical back: Neck supple.  Skin:    General: Skin is warm and dry.     Capillary Refill: Capillary refill takes less than 2 seconds.     Findings: Lesion present.     Comments: 2 cm laceration on left palm.  Bleeding controlled.   Neurological:     General: No focal deficit present.     Mental Status: He is alert and oriented to person, place, and time.     Sensory: No sensory deficit.     Motor: No weakness.  Psychiatric:        Mood and Affect: Mood normal.        Behavior: Behavior normal.      UC Treatments / Results  Labs (all labs ordered are listed, but only abnormal results are displayed) Labs  Reviewed - No data to display  EKG   Radiology No results found.  Procedures Laceration  Repair  Date/Time: 02/21/2022 2:48 PM  Performed by: Sharion Balloon, NP Authorized by: Sharion Balloon, NP   Consent:    Consent obtained:  Verbal   Consent given by:  Patient   Risks discussed:  Infection, pain, poor cosmetic result and poor wound healing Universal protocol:    Procedure explained and questions answered to patient or proxy's satisfaction: yes   Anesthesia:    Anesthesia method:  Local infiltration   Local anesthetic:  Lidocaine 1% w/o epi Laceration details:    Location:  Hand   Hand location:  L palm   Length (cm):  2   Depth (mm):  2 Pre-procedure details:    Preparation:  Patient was prepped and draped in usual sterile fashion Exploration:    Hemostasis achieved with:  Direct pressure   Imaging outcome: foreign body not noted     Wound exploration: wound explored through full range of motion and entire depth of wound visualized   Treatment:    Area cleansed with:  Shur-Clens   Amount of cleaning:  Standard   Irrigation solution:  Sterile saline   Irrigation method:  Syringe   Visualized foreign bodies/material removed: no   Skin repair:    Repair method:  Sutures   Suture size:  3-0   Suture material:  Prolene   Suture technique:  Simple interrupted   Number of sutures:  3 Approximation:    Approximation:  Close Repair type:    Repair type:  Simple Post-procedure details:    Dressing:  Antibiotic ointment and non-adherent dressing   Procedure completion:  Tolerated well, no immediate complications  (including critical care time)  Medications Ordered in UC Medications - No data to display  Initial Impression / Assessment and Plan / UC Course  I have reviewed the triage vital signs and the nursing notes.  Pertinent labs & imaging results that were available during my care of the patient were reviewed by me and considered in my medical decision making (see chart for details).    Laceration of the left hand.  Evaded blood pressure reading with  hypertension.  3 sutures.  Tetanus up-to-date.  Wound care instructions and signs of infection discussed.  Education provided on laceration care.  Instructed patient to follow up right away if he notes signs of infection.  Also discussed with patient that his blood pressure is elevated today and needs to be rechecked by PCP in 1-2 weeks.  Education provided on managing hypertension.  He agrees to plan of care.    Final Clinical Impressions(s) / UC Diagnoses   Final diagnoses:  Laceration of left hand without foreign body, initial encounter  Elevated blood pressure reading in office with diagnosis of hypertension     Discharge Instructions      Your stitches need to be taken out in 7-10 days.    Keep your wound clean and dry.  Wash it gently twice a day with soap and water.     Follow up right away if you see signs of infection, such as increased pain, redness, pus-like drainage, warmth, fever, chills, or other concerning symptoms.    Your blood pressure is elevated today at 168/111.  Please have this rechecked by your primary care provider in 1-2 weeks.           ED Prescriptions   None    PDMP not reviewed this  encounter.   Sharion Balloon, NP 02/21/22 1450

## 2022-02-21 NOTE — Discharge Instructions (Addendum)
Your stitches need to be taken out in 7-10 days.    Keep your wound clean and dry.  Wash it gently twice a day with soap and water.     Follow up right away if you see signs of infection, such as increased pain, redness, pus-like drainage, warmth, fever, chills, or other concerning symptoms.    Your blood pressure is elevated today at 168/111.  Please have this rechecked by your primary care provider in 1-2 weeks.

## 2022-02-21 NOTE — ED Notes (Signed)
Hand soaking in warm water to remove liquid bandage.

## 2022-02-21 NOTE — ED Triage Notes (Signed)
Patient to Urgent Care with complaints of laceration present to left hand. Reports cutting it on something sharp in shed today. Patient applied liquid bandage to stop the bleeding. No active bleeding at this time.   Last tdap 10/19/2017.

## 2022-02-22 LAB — POST-VAS SPERM EVALUATION,QUAL: Volume: 1.5 mL

## 2022-02-28 ENCOUNTER — Ambulatory Visit
Admission: EM | Admit: 2022-02-28 | Discharge: 2022-02-28 | Disposition: A | Discharge status: Home / Self Care | Payer: 59 | Attending: Internal Medicine | Admitting: Internal Medicine

## 2022-02-28 DIAGNOSIS — S61412A Laceration without foreign body of left hand, initial encounter: Secondary | ICD-10-CM

## 2022-02-28 NOTE — ED Notes (Addendum)
3 sutures removed from the patients left palm, Pt. Tolerated removal well and antibiotic ointment and bandage given for reinforcement. Home care instructions provided.

## 2022-02-28 NOTE — ED Notes (Signed)
Patient here for suture removal for laceration on 02/21/2022.

## 2022-03-05 ENCOUNTER — Encounter: Payer: Self-pay | Admitting: Internal Medicine

## 2022-03-05 ENCOUNTER — Telehealth (INDEPENDENT_AMBULATORY_CARE_PROVIDER_SITE_OTHER): Payer: 59 | Admitting: Internal Medicine

## 2022-03-05 VITALS — Temp 101.6°F

## 2022-03-05 DIAGNOSIS — J111 Influenza due to unidentified influenza virus with other respiratory manifestations: Secondary | ICD-10-CM | POA: Diagnosis not present

## 2022-03-05 MED ORDER — OSELTAMIVIR PHOSPHATE 75 MG PO CAPS: 75.0000 mg | ORAL_CAPSULE | Freq: Two times a day (BID) | ORAL | 0 refills | Status: DC

## 2022-03-05 NOTE — Progress Notes (Signed)
Subjective:    Patient ID: Ronnie Alexander, male    DOB: 1980/07/24, 42 y.o.   MRN: [id number]  HPI Video virtual visit due to respiratory infection Identification done Reviewed limitations and billing and he gave consent Participants--patient in his home and I am in my office  Started 2 days ago---headache, cough, diarrhea, slight vomiting (once) Fever since that time Cough is really bad for his headache Some chills and sweats Bad myalgia--bath seems to soothe Sore throat---teeth pain No SOB  Dayquil and nyquil--very little help  Current Outpatient Medications on File Prior to Visit  Medication Sig Dispense Refill   acetaminophen (TYLENOL) 325 MG tablet Take 650 mg by mouth every 6 (six) hours as needed.     albuterol (VENTOLIN HFA) 108 (90 Base) MCG/ACT inhaler Inhale 1-2 puffs into the lungs every 6 (six) hours as needed for wheezing or shortness of breath. 8 g 0   cetirizine (ZYRTEC ALLERGY) 10 MG tablet      hydrochlorothiazide (HYDRODIURIL) 12.5 MG tablet Take 2 tablets (25 mg total) by mouth daily.     hydrOXYzine (ATARAX) 10 MG tablet Take 1 tablet (10 mg total) by mouth 3 (three) times daily as needed for anxiety. 30 tablet 1   ibuprofen (ADVIL,MOTRIN) 600 MG tablet Take 1 tablet (600 mg total) by mouth every 6 (six) hours as needed. 30 tablet 0   tadalafil (CIALIS) 5 MG tablet Take 5 mg by mouth daily.     No current facility-administered medications on file prior to visit.    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   Hypertension    Kidney stones    Migraines    with aura, throbbing pain, with photo/phonophobia    Past Surgical History:  Procedure Laterality Date   CLEFT PALATE REPAIR     17 total procedures   FRACTURE SURGERY     L tibia facture    Family History  Problem  Relation Age of Onset   Alcohol abuse Mother    Hyperlipidemia Mother    Depression Mother    Bipolar disorder Mother    COPD Maternal Grandfather    Colon cancer Neg Hx    Prostate cancer Neg Hx     Social History   Socioeconomic History   Marital status: Married    Spouse name: Not on file   Number of children: 1   Years of education: Not on file   Highest education level: Some college, no degree  Occupational History   Not on file  Tobacco Use   Smoking status: Former    Packs/day: 1.00    Years: 14.00    Total pack years: 14.00    Types: Cigarettes    Quit date: 08/29/2010    Years since quitting: 11.5    Passive exposure: Past   Smokeless tobacco: Never  Vaping Use   Vaping Use: Every day   Substances: Nicotine  Substance and Sexual Activity   Alcohol use: Yes    Alcohol/week: 2.0 standard drinks of alcohol    Types: 2 Glasses of wine per week    Comment: rare   Drug use: No   Sexual activity: Not Currently  Other Topics Concern   Not on file  Social History  Narrative   Married 2010- divorced 2020.     Remarried 2023.     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     Social Determinants of Health   Financial Resource Strain: Low Risk  (01/06/2019)   Overall Financial Resource Strain (CARDIA)    Difficulty of Paying Living Expenses: Not hard at all  Food Insecurity: No Food Insecurity (01/06/2019)   Hunger Vital Sign    Worried About Running Out of Food in the Last Year: Never true    Ran Out of Food in the Last Year: Never true  Transportation Needs: No Transportation Needs (01/06/2019)   PRAPARE - Hydrologist (Medical): No    Lack of Transportation (Non-Medical): No  Physical Activity: Sufficiently Active (01/06/2019)   Exercise Vital Sign    Days of Exercise per Week: 5 days    Minutes of Exercise per Session: 80 min  Stress: No Stress Concern Present (01/06/2019)   [place]    Feeling of Stress : Not at all  Social Connections: Unknown (01/06/2019)   Social Connection and Isolation Panel [NHANES]    Frequency of Communication with Friends and Family: Not on file    Frequency of Social Gatherings with Friends and Family: Not on file    Attends Religious Services: Never    Active Member of Clubs or Organizations: No    Attends Archivist Meetings: Never    Marital Status: Separated  Intimate Partner Violence: Not At Risk (01/06/2019)   Humiliation, Afraid, Rape, and Kick questionnaire    Fear of Current or Ex-Partner: No    Emotionally Abused: No    Physically Abused: No    Sexually Abused: No   Review of Systems Is prone to sinus problems--cleft palate repair No appetite --just a little Is able to drink water Did not take flu vaccine COVID tests negative (2)    Objective:   Physical Exam Constitutional:      Comments: Looks uncomfortable but no distress  Pulmonary:     Effort: Pulmonary effort is normal. No respiratory distress.  Neurological:     Mental Status: He is alert.            Assessment & Plan:

## 2022-03-05 NOTE — Assessment & Plan Note (Addendum)
No way to test but fairly classic presentation Discussed tamiflu--he would like to try Needs NSAIDs for pain/fever---can add tylenol as well OTC cough med prn  If sinuses worsen next week--would try empiric antibiotic OOW till Monday at the earliest

## 2022-05-24 ENCOUNTER — Other Ambulatory Visit: Payer: Self-pay | Admitting: Family Medicine

## 2022-05-24 DIAGNOSIS — I10 Essential (primary) hypertension: Secondary | ICD-10-CM

## 2022-05-26 ENCOUNTER — Telehealth (INDEPENDENT_AMBULATORY_CARE_PROVIDER_SITE_OTHER): Payer: 59 | Admitting: Family Medicine

## 2022-05-26 ENCOUNTER — Encounter: Payer: Self-pay | Admitting: Family Medicine

## 2022-05-26 DIAGNOSIS — R0981 Nasal congestion: Secondary | ICD-10-CM

## 2022-05-26 DIAGNOSIS — R519 Headache, unspecified: Secondary | ICD-10-CM

## 2022-05-26 MED ORDER — AMOXICILLIN-POT CLAVULANATE 875-125 MG PO TABS: 1.0000 | ORAL_TABLET | Freq: Two times a day (BID) | ORAL | 0 refills | Status: DC

## 2022-05-26 NOTE — Progress Notes (Signed)
Virtual Visit via Video Note  I connected with Ronnie Alexander  on 05/26/22 at 11:00 AM EDT by a video enabled telemedicine application and verified that I am speaking with the correct person using two identifiers.  Location patient: [place] Location provider:work or home office Persons participating in the virtual visit: patient, provider  I discussed the limitations and requested verbal permission for telemedicine visit. The patient expressed understanding and agreed to proceed.   HPI:  Acute telemedicine visit for sinus issues: -Onset:has been dealing with allergies the last few weeks, but worse the last 2-3 days ago -Symptoms include: sneezing, itchy nose and eyes, some pnd, nasal congestion, but now if worsening with some L maxillary sinus discomfort and some tooth pain - feels is getting a sinus infection -negative x2 covid tests -reports always gets this in the spring - reports PCP usually gives him Augmentin for this which works well for him -Denies: fevers, CP, SOB -Has tried: allergy meds - he can't do nasal sprays as they cause nose bleeds, is doing the nasal saline -Pertinent past medical history: see below, has hx of sinus infection, ear infections -Pertinent medication allergies: Allergies  Allergen Reactions   Citalopram     At dose of 20mg  or higher- lack of ejaculation   Effexor [Venlafaxine]     intolerant  -COVID-19 vaccine status:  Immunization History  Administered Date(s) Administered   PFIZER(Purple Top)SARS-COV-2 Vaccination 09/16/2019, 10/09/2019   Tdap 10/19/2017     ROS: See pertinent positives and negatives per HPI.  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   Hypertension    Kidney stones    Migraines    with aura, throbbing pain, with photo/phonophobia    Past Surgical History:  Procedure Laterality Date   CLEFT PALATE REPAIR     17 total procedures   FRACTURE SURGERY     L tibia facture     Current Outpatient  Medications:    acetaminophen (TYLENOL) 325 MG tablet, Take 650 mg by mouth every 6 (six) hours as needed., Disp: , Rfl:    albuterol (VENTOLIN HFA) 108 (90 Base) MCG/ACT inhaler, Inhale 1-2 puffs into the lungs every 6 (six) hours as needed for wheezing or shortness of breath., Disp: 8 g, Rfl: 0   amoxicillin-clavulanate (AUGMENTIN) 875-125 MG tablet, Take 1 tablet by mouth 2 (two) times daily., Disp: 20 tablet, Rfl: 0   cetirizine (ZYRTEC ALLERGY) 10 MG tablet, , Disp: , Rfl:    hydrochlorothiazide (HYDRODIURIL) 12.5 MG tablet, Take 2 tablets (25 mg total) by mouth daily., Disp: , Rfl:    hydrOXYzine (ATARAX) 10 MG tablet, Take 1 tablet (10 mg total) by mouth 3 (three) times daily as needed for anxiety., Disp: 30 tablet, Rfl: 1   tadalafil (CIALIS) 5 MG tablet, Take 5 mg by mouth daily., Disp: , Rfl:    ibuprofen (ADVIL,MOTRIN) 600 MG tablet, Take 1 tablet (600 mg total) by mouth every 6 (six) hours as needed. (Patient not taking: Reported on 05/26/2022), Disp: 30 tablet, Rfl: 0   oseltamivir (TAMIFLU) 75 MG capsule, Take 1 capsule (75 mg total) by mouth 2 (two) times daily. (Patient not taking: Reported on 05/26/2022), Disp: 10 capsule, Rfl: 0  EXAM:  VITALS per patient if applicable:  GENERAL: alert, oriented, appears well and in no acute distress  HEENT: atraumatic, conjunttiva clear, no obvious abnormalities on inspection of external nose and ears  NECK: normal movements of the head and neck  LUNGS: on inspection no signs of respiratory distress, breathing rate appears normal, no obvious gross SOB, gasping or wheezing  CV: no obvious cyanosis  MS: moves all visible extremities without noticeable abnormality  PSYCH/NEURO: pleasant and cooperative, no obvious depression or anxiety, speech and thought processing grossly intact  ASSESSMENT AND PLAN:  Discussed the following assessment and plan:  Nasal congestion  Facial discomfort  -we discussed possible serious and likely  etiologies, options for evaluation and workup, limitations of telemedicine visit vs in person visit, treatment, treatment risks and precautions. Pt is agreeable to treatment via telemedicine at this moment. Query sinusitis, underlying allergic rhinitis, vs other. He has opted for trial of empiric Augmentin after discussion of risks.   Advised to seek prompt virtual visit or in person care if worsening, new symptoms arise, or if is not improving with treatment as expected per our conversation of expected course. Discussed options for follow up care. Did let this patient know that I do telemedicine on Tuesdays and Thursdays for [place] and those are the days I am logged into the system. Advised to schedule follow up visit with PCP, [place] virtual visits or [facility] if any further questions or concerns to avoid delays in care.   I discussed the assessment and treatment plan with the patient. The patient was provided an opportunity to ask questions and all were answered. The patient agreed with the plan and demonstrated an understanding of the instructions.     Lucretia Kern, DO

## 2022-05-26 NOTE — Patient Instructions (Addendum)
-I sent the medication(s) we discussed to your pharmacy: Meds ordered this encounter  Medications   amoxicillin-clavulanate (AUGMENTIN) 875-125 MG tablet    Sig: Take 1 tablet by mouth 2 (two) times daily.    Dispense:  20 tablet    Refill:  0     I hope you are feeling better soon!  Seek in person care promptly if your symptoms worsen, new concerns arise or you are not improving with treatment.  It was nice to meet you today. I help [facility] out with telemedicine visits on Tuesdays and Thursdays and am happy to help if you need a virtual follow up visit on those days. Otherwise, if you have any concerns or questions following this visit please schedule a follow up visit with your Primary Care office or seek care at a local urgent care clinic to avoid delays in care. If you are having severe or life threatening symptoms please call 911 and/or go to the nearest emergency room.      Amoxicillin; Clavulanic Acid Tablets What is this medication? AMOXICILLIN; CLAVULANIC ACID (a mox i SIL in; KLAV yoo lan ic AS id) treats infections caused by bacteria. It belongs to a group of medications called penicillin antibiotics. It will not treat colds, the flu, or infections caused by viruses. This medicine may be used for other purposes; ask your health care provider or pharmacist if you have questions. COMMON BRAND NAME(S): Augmentin What should I tell my care team before I take this medication? They need to know if you have any of these conditions: Kidney disease Liver disease Mononucleosis Stomach or intestine problems such as colitis An unusual or allergic reaction to amoxicillin, other penicillin or cephalosporin antibiotics, clavulanic acid, other medications, foods, dyes, or preservatives Pregnant or trying to get pregnant Breast-feeding How should I use this medication? Take this medication by mouth. Take it as directed on the prescription label at the same time every day. Take it with  food at the start of a meal or snack. Take all of this medication unless your care team tells you to stop it early. Keep taking it even if you think you are better. Talk to your care team about the use of this medication in children. While it may be prescribed for selected conditions, precautions do apply. Overdosage: If you think you have taken too much of this medicine contact a poison control center or emergency room at once. NOTE: This medicine is only for you. Do not share this medicine with others. What if I miss a dose? If you miss a dose, take it as soon as you can. If it is almost time for your next dose, take only that dose. Do not take double or extra doses. What may interact with this medication? Allopurinol Anticoagulants Birth control pills Methotrexate Probenecid This list may not describe all possible interactions. Give your health care provider a list of all the medicines, herbs, non-prescription drugs, or dietary supplements you use. Also tell them if you smoke, drink alcohol, or use illegal drugs. Some items may interact with your medicine. What should I watch for while using this medication? Tell your care team if your symptoms do not start to get better or if they get worse. This medication may cause serious skin reactions. They can happen weeks to months after starting the medication. Contact your care team right away if you notice fevers or flu-like symptoms with a rash. The rash may be red or purple and then turn into blisters  or peeling of the skin. Or, you might notice a red rash with swelling of the face, lips or lymph nodes in your neck or under your arms. Do not treat diarrhea with over the counter products. Contact your care team if you have diarrhea that lasts more than 2 days or if it is severe and watery. If you have diabetes, you may get a false-positive result for sugar in your urine. Check with your care team. Birth control may not work properly while you are  taking this medication. Talk to your care team about using an extra method of birth control. What side effects may I notice from receiving this medication? Side effects that you should report to your care team as soon as possible: Allergic reactions--skin rash, itching, hives, swelling of the face, lips, tongue, or throat Liver injury--right upper belly pain, loss of appetite, nausea, light-colored stool, dark yellow or brown urine, yellowing skin or eyes, unusual weakness or fatigue Redness, blistering, peeling, or loosening of the skin, including inside the mouth Severe diarrhea, fever Unusual vaginal discharge, itching, or odor Side effects that usually do not require medical attention (report to your care team if they continue or are bothersome): Diarrhea Nausea Vomiting This list may not describe all possible side effects. Call your doctor for medical advice about side effects. You may report side effects to FDA at 1-800-FDA-1088. Where should I keep my medication? Keep out of the reach of children and pets. Store at room temperature between 20 and 25 degrees C (68 and 77 degrees F). Throw away any unused medication after the expiration date. NOTE: This sheet is a summary. It may not cover all possible information. If you have questions about this medicine, talk to your doctor, pharmacist, or health care provider.  2023 Elsevier/Gold Standard (2018-09-12 00:00:00)

## 2022-06-04 ENCOUNTER — Other Ambulatory Visit (INDEPENDENT_AMBULATORY_CARE_PROVIDER_SITE_OTHER): Payer: BC Managed Care – PPO

## 2022-06-04 DIAGNOSIS — I10 Essential (primary) hypertension: Secondary | ICD-10-CM | POA: Diagnosis not present

## 2022-06-04 LAB — COMPREHENSIVE METABOLIC PANEL
ALT: 41 U/L (ref 0–53)
AST: 29 U/L (ref 0–37)
Albumin: 4.4 g/dL (ref 3.5–5.2)
Alkaline Phosphatase: 57 U/L (ref 39–117)
BUN: 13 mg/dL (ref 6–23)
CO2: 26 mEq/L (ref 19–32)
Calcium: 9.3 mg/dL (ref 8.4–10.5)
Chloride: 101 mEq/L (ref 96–112)
Creatinine, Ser: 0.99 mg/dL (ref 0.40–1.50)
GFR: 94.1 mL/min (ref >60.00)
Glucose, Bld: 82 mg/dL (ref 70–99)
Potassium: 3.9 mEq/L (ref 3.5–5.1)
Sodium: 136 mEq/L (ref 135–145)
Total Bilirubin: 0.5 mg/dL (ref 0.2–1.2)
Total Protein: 7.4 g/dL (ref 6.0–8.3)

## 2022-06-04 LAB — LIPID PANEL
Cholesterol: 224 mg/dL — ABNORMAL HIGH (ref 0–200)
HDL: 45.4 mg/dL (ref >39.00)
Total CHOL/HDL Ratio: 5
Triglycerides: 411 mg/dL — ABNORMAL HIGH (ref 0.0–149.0)

## 2022-06-04 LAB — LDL CHOLESTEROL, DIRECT: Direct LDL: 129 mg/dL

## 2022-06-11 ENCOUNTER — Encounter: Payer: Self-pay | Admitting: Family Medicine

## 2022-06-11 ENCOUNTER — Ambulatory Visit (INDEPENDENT_AMBULATORY_CARE_PROVIDER_SITE_OTHER): Payer: BC Managed Care – PPO | Admitting: Family Medicine

## 2022-06-11 VITALS — BP 142/90 | HR 76 | Temp 97.7°F | Ht 72.0 in | Wt 226.0 lb

## 2022-06-11 DIAGNOSIS — Z0000 Encounter for general adult medical examination without abnormal findings: Secondary | ICD-10-CM | POA: Diagnosis not present

## 2022-06-11 DIAGNOSIS — J302 Other seasonal allergic rhinitis: Secondary | ICD-10-CM

## 2022-06-11 DIAGNOSIS — E785 Hyperlipidemia, unspecified: Secondary | ICD-10-CM

## 2022-06-11 DIAGNOSIS — Z7189 Other specified counseling: Secondary | ICD-10-CM

## 2022-06-11 DIAGNOSIS — I10 Essential (primary) hypertension: Secondary | ICD-10-CM

## 2022-06-11 DIAGNOSIS — Z659 Problem related to unspecified psychosocial circumstances: Secondary | ICD-10-CM

## 2022-06-11 DIAGNOSIS — G43109 Migraine with aura, not intractable, without status migrainosus: Secondary | ICD-10-CM

## 2022-06-11 MED ORDER — ALBUTEROL SULFATE HFA 108 (90 BASE) MCG/ACT IN AERS: 1.0000 | INHALATION_SPRAY | Freq: Four times a day (QID) | RESPIRATORY_TRACT | 2 refills | Status: DC | PRN

## 2022-06-11 MED ORDER — HYDROXYZINE HCL 10 MG PO TABS: 10.0000 mg | ORAL_TABLET | Freq: Three times a day (TID) | ORAL | 3 refills | Status: AC | PRN

## 2022-06-11 NOTE — Patient Instructions (Addendum)
Recheck fasting labs in about 2-3 months.  If your BP stays elevated then let me know.  Take care.  Glad to see you.

## 2022-06-11 NOTE — Progress Notes (Signed)
CPE- See plan.  Routine anticipatory guidance given to patient.  See health maintenance.  The possibility exists that previously documented standard health maintenance information may have been brought forward from a previous encounter into this note.  If needed, that same information has been updated to reflect the current situation based on today's encounter.    Tetanus 2019 Flu encouraged. PNA and shingles not due.  Covid prev done.   Colon and prostate cancer screening not due.  Living will d/w pt.  April Brodbeck designated if patient were incapacitated.   Diet and exercise d/w pt.   Inc SABA use with pollen trigger this spring.  Not frequent use but he keeps it on hand.  Routine cautions d/w pt.   Hydroxyzine used prn for anxiety/higher stress situations.  It helps.  Usually taking 1/2 tab prn.    No recent migraines.   TG elevation.  He recently made sig diet changes.  Off HCTZ in the meantime.  He had dry mouth on med.  Discussed options.  No CP, SOB, BLE edema.    PMH and SH reviewed  Meds, vitals, and allergies reviewed.   ROS: Per HPI.  Unless specifically indicated otherwise in HPI, the patient denies:  General: fever. Eyes: acute vision changes ENT: sore throat Cardiovascular: chest pain Respiratory: SOB GI: vomiting GU: dysuria Musculoskeletal: acute back pain Derm: acute rash Neuro: acute motor dysfunction Psych: worsening mood Endocrine: polydipsia Heme: bleeding Allergy: hayfever  GEN: nad, alert and oriented HEENT: ncat, postsurgical changes noted at baseline. NECK: supple w/o LA CV: rrr. PULM: ctab, no inc wob ABD: soft, +bs EXT: no edema SKIN: no acute rash

## 2022-06-14 DIAGNOSIS — J302 Other seasonal allergic rhinitis: Secondary | ICD-10-CM | POA: Insufficient documentation

## 2022-06-14 NOTE — Assessment & Plan Note (Signed)
Living will d/w pt.  April Cocking designated if patient were incapacitated.   

## 2022-06-14 NOTE — Assessment & Plan Note (Signed)
TG elevation.  He recently made sig diet changes.  Off HCTZ in the meantime.  He had dry mouth on med.  Discussed options.  No CP, SOB, BLE edema.   We opted to recheck fasting labs in about 2-3 months.  If BP stays elevated then he can let me know.  Both his lipids and blood pressure may improve with continue work on diet and exercise.

## 2022-06-14 NOTE — Assessment & Plan Note (Signed)
Inc SABA use with pollen trigger this spring.  Not frequent use but he keeps it on hand.  Routine cautions d/w pt. he can update me as needed.

## 2022-06-14 NOTE — Assessment & Plan Note (Signed)
Fortunately no recent symptoms. 

## 2022-06-14 NOTE — Assessment & Plan Note (Signed)
Tetanus 2019 Flu encouraged. PNA and shingles not due.  Covid prev done.   Colon and prostate cancer screening not due.  Living will d/w pt.  April Deblasi designated if patient were incapacitated.   Diet and exercise d/w pt.  

## 2022-06-14 NOTE — Assessment & Plan Note (Signed)
Hydroxyzine used prn for anxiety/higher stress situations.  It helps.  Usually taking 1/2 tab prn.  Continue as is and update me as needed.

## 2022-07-29 ENCOUNTER — Telehealth: Payer: Self-pay | Admitting: Family Medicine

## 2022-07-29 NOTE — Telephone Encounter (Signed)
Type of forms received: employment's wellness exam form   Routed to: [employer]   Paperwork received by : Audree Camel    Individual made aware of 3-5 business day turn around (Y/N): Y   Form completed and patient made aware of charges(Y/N): Y    Faxed to : pt requested a call back @ [phone] once ppw is comp   Form location:  [employer] folder

## 2022-07-31 NOTE — Telephone Encounter (Signed)
Form placed in Dr. Duncans inbox to sign 

## 2022-08-03 NOTE — Telephone Encounter (Signed)
Notified patient form is ready for pick up. Form has been placed up front in folder.

## 2023-02-25 ENCOUNTER — Encounter: Payer: Self-pay | Admitting: Family Medicine

## 2023-02-25 ENCOUNTER — Ambulatory Visit: Payer: BC Managed Care – PPO | Admitting: Family Medicine

## 2023-02-25 VITALS — BP 146/88 | HR 70 | Temp 97.9°F | Ht 72.0 in | Wt 209.4 lb

## 2023-02-25 DIAGNOSIS — Z7189 Other specified counseling: Secondary | ICD-10-CM

## 2023-02-25 DIAGNOSIS — E785 Hyperlipidemia, unspecified: Secondary | ICD-10-CM | POA: Diagnosis not present

## 2023-02-25 DIAGNOSIS — Z0000 Encounter for general adult medical examination without abnormal findings: Secondary | ICD-10-CM | POA: Diagnosis not present

## 2023-02-25 DIAGNOSIS — F419 Anxiety disorder, unspecified: Secondary | ICD-10-CM

## 2023-02-25 DIAGNOSIS — I10 Essential (primary) hypertension: Secondary | ICD-10-CM

## 2023-02-25 NOTE — Progress Notes (Signed)
 CPE- See plan.  Routine anticipatory guidance given to patient.  See health maintenance.  The possibility exists that previously documented standard health maintenance information may have been brought forward from a previous encounter into this note.  If needed, that same information has been updated to reflect the current situation based on today's encounter.   Tetanus 2019 Flu encouraged. Declined.  PNA and shingles not due.  Covid prev done.   Colon and prostate cancer screening not due.  Living will d/w pt.  Ronnie Alexander designated if patient were incapacitated. Diet and exercise d/w pt. intentional weight loss recently.   HIV and HCV screening prev done at [facility], in the 2000s.    Rare use hydroxyzine .  Anxiety d/w pt.  He is financial planner at work, d/w pt.  Home situation is good.  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   Initially with elevated BP.  Recheck improved.   No recent headache or renal stones.    PMH and SH reviewed  Meds, vitals, and allergies reviewed.   ROS: Per HPI.  Unless specifically indicated otherwise in HPI, the patient denies:  General: fever. Eyes: acute vision changes ENT: sore throat Cardiovascular: chest pain Respiratory: SOB GI: vomiting GU: dysuria Musculoskeletal: acute back pain Derm: acute rash Neuro: acute motor dysfunction Psych: worsening mood Endocrine: polydipsia Heme: bleeding Allergy: hayfever  GEN: nad, alert and oriented HEENT: mucous membranes moist, chronic changes in the L TM NECK: supple w/o LA CV: rrr. PULM: ctab, no inc wob ABD: soft, +bs EXT: no edema SKIN: no acute rash

## 2023-02-25 NOTE — Patient Instructions (Addendum)
 Check your BP at home and update me if persistently above 140/90.   [employer] makes reliable cuffs.  Don't use a wrist cuff.   Go to the lab on the way out.   If you have mychart we'll likely use that to update you.    Take care.  Glad to see you. I would get a flu shot each fall.

## 2023-02-26 LAB — LIPID PANEL
Cholesterol: 213 mg/dL — ABNORMAL HIGH (ref 0–200)
HDL: 55.5 mg/dL (ref >39.00)
LDL Cholesterol: 128 mg/dL — ABNORMAL HIGH (ref 0–99)
NonHDL: 157.22
Total CHOL/HDL Ratio: 4
Triglycerides: 148 mg/dL (ref 0.0–149.0)
VLDL: 29.6 mg/dL (ref 0.0–40.0)

## 2023-02-26 LAB — COMPREHENSIVE METABOLIC PANEL
ALT: 19 U/L (ref 0–53)
AST: 19 U/L (ref 0–37)
Albumin: 4.5 g/dL (ref 3.5–5.2)
Alkaline Phosphatase: 46 U/L (ref 39–117)
BUN: 16 mg/dL (ref 6–23)
CO2: 24 meq/L (ref 19–32)
Calcium: 9.2 mg/dL (ref 8.4–10.5)
Chloride: 103 meq/L (ref 96–112)
Creatinine, Ser: 0.84 mg/dL (ref 0.40–1.50)
GFR: 107.17 mL/min (ref >60.00)
Glucose, Bld: 79 mg/dL (ref 70–99)
Potassium: 4.2 meq/L (ref 3.5–5.1)
Sodium: 139 meq/L (ref 135–145)
Total Bilirubin: 0.4 mg/dL (ref 0.2–1.2)
Total Protein: 7.4 g/dL (ref 6.0–8.3)

## 2023-02-28 DIAGNOSIS — E785 Hyperlipidemia, unspecified: Secondary | ICD-10-CM | POA: Insufficient documentation

## 2023-02-28 NOTE — Assessment & Plan Note (Signed)
 Rare hydroxyzine use.  Continue as is.  He can update me as needed.

## 2023-02-28 NOTE — Assessment & Plan Note (Signed)
 Tetanus 2019 Flu encouraged. Declined.  PNA and shingles not due.  Covid prev done.   Colon and prostate cancer screening not due.  Living will d/w pt.  Ronnie Alexander designated if patient were incapacitated. Diet and exercise d/w pt. intentional weight loss recently.   HIV and HCV screening prev done at [facility], in the 2000s.

## 2023-02-28 NOTE — Assessment & Plan Note (Signed)
History of.  See notes on labs. 

## 2023-02-28 NOTE — Assessment & Plan Note (Signed)
 He can check his blood pressure out of clinic and if persistently elevated and he can let me know.  See notes on labs.

## 2023-02-28 NOTE — Assessment & Plan Note (Signed)
Living will d/w pt.  Ronnie Alexander designated if patient were incapacitated.   

## 2023-07-30 ENCOUNTER — Ambulatory Visit: Admitting: Family Medicine

## 2023-07-30 ENCOUNTER — Encounter: Payer: Self-pay | Admitting: Family Medicine

## 2023-07-30 VITALS — BP 146/92 | HR 85 | Temp 98.5°F | Ht 72.0 in | Wt 226.4 lb

## 2023-07-30 DIAGNOSIS — M25519 Pain in unspecified shoulder: Secondary | ICD-10-CM

## 2023-07-30 DIAGNOSIS — I10 Essential (primary) hypertension: Secondary | ICD-10-CM

## 2023-07-30 DIAGNOSIS — M67471 Ganglion, right ankle and foot: Secondary | ICD-10-CM | POA: Diagnosis not present

## 2023-07-30 NOTE — Progress Notes (Signed)
 Lump on R foot. Mass on the dorsilateral proximal midfoot. Recently noted.  Constant over the last few days.  Painful some of the time, can have local paresthesia with walking.  No trauma.  No drainage.  Skin looks normal locally.  No redness, no bruising.    R shoulder popping with ROM.  Overhead movement is okay but concurrent ext rotation and reaching backward is painful with a popping sound.  Pain sleeping on R side at night.    BP improved on recheck in clinic.  D/w pt about rechecking BP out of clinic.   Meds, vitals, and allergies reviewed.   ROS: Per HPI unless specifically indicated in ROS section   Nad Ncat Neck supple Right shoulder popping with range of motion.  Overhead movement is intact but when he has concurrent external rotation/reaching backward with abduction he has pain and an audible pop. No arm drop. Grip intact. No rash. Right foot with normal inspection except mass on the dorsilateral proximal midfoot.  This does not feel bony.  No spreading erythema or rash. Mass transilluminates.  Likely ganglion cyst.

## 2023-07-30 NOTE — Patient Instructions (Addendum)
 Please recheck BP out of clinic when at rest.  Update me if persistently >140/>90.  Ask ortho about your shoulder and the foot.

## 2023-08-01 DIAGNOSIS — M25519 Pain in unspecified shoulder: Secondary | ICD-10-CM | POA: Insufficient documentation

## 2023-08-01 DIAGNOSIS — M67471 Ganglion, right ankle and foot: Secondary | ICD-10-CM | POA: Insufficient documentation

## 2023-08-01 NOTE — Assessment & Plan Note (Signed)
 With the concern for potential rotator cuff and/or labral pathology, I want orthopedic input.  Refer.  He agrees.

## 2023-08-01 NOTE — Assessment & Plan Note (Signed)
 I asked him to please recheck BP out of clinic when at rest.  Update me if persistently >140/>90.

## 2023-08-01 NOTE — Assessment & Plan Note (Signed)
 Likely ganglion.  Aspiration would likely be insufficient and potentially counterproductive.  Discussed.  Refer to Ortho.

## 2023-08-25 DIAGNOSIS — M7541 Impingement syndrome of right shoulder: Secondary | ICD-10-CM | POA: Diagnosis not present

## 2023-08-25 DIAGNOSIS — Z6831 Body mass index (BMI) 31.0-31.9, adult: Secondary | ICD-10-CM | POA: Diagnosis not present

## 2023-08-25 DIAGNOSIS — G8929 Other chronic pain: Secondary | ICD-10-CM | POA: Diagnosis not present

## 2023-08-25 DIAGNOSIS — M25511 Pain in right shoulder: Secondary | ICD-10-CM | POA: Diagnosis not present

## 2023-09-04 DIAGNOSIS — F4323 Adjustment disorder with mixed anxiety and depressed mood: Secondary | ICD-10-CM | POA: Diagnosis not present

## 2023-09-13 DIAGNOSIS — M7541 Impingement syndrome of right shoulder: Secondary | ICD-10-CM | POA: Diagnosis not present

## 2023-09-13 DIAGNOSIS — G8929 Other chronic pain: Secondary | ICD-10-CM | POA: Diagnosis not present

## 2023-09-13 DIAGNOSIS — M25511 Pain in right shoulder: Secondary | ICD-10-CM | POA: Diagnosis not present

## 2023-09-13 DIAGNOSIS — M6281 Muscle weakness (generalized): Secondary | ICD-10-CM | POA: Diagnosis not present

## 2023-09-23 DIAGNOSIS — G8929 Other chronic pain: Secondary | ICD-10-CM | POA: Diagnosis not present

## 2023-09-23 DIAGNOSIS — M6281 Muscle weakness (generalized): Secondary | ICD-10-CM | POA: Diagnosis not present

## 2023-09-23 DIAGNOSIS — M25511 Pain in right shoulder: Secondary | ICD-10-CM | POA: Diagnosis not present

## 2023-09-23 DIAGNOSIS — M7541 Impingement syndrome of right shoulder: Secondary | ICD-10-CM | POA: Diagnosis not present

## 2023-09-29 DIAGNOSIS — M25511 Pain in right shoulder: Secondary | ICD-10-CM | POA: Diagnosis not present

## 2023-09-29 DIAGNOSIS — Z6831 Body mass index (BMI) 31.0-31.9, adult: Secondary | ICD-10-CM | POA: Diagnosis not present

## 2023-09-29 DIAGNOSIS — G8929 Other chronic pain: Secondary | ICD-10-CM | POA: Diagnosis not present

## 2023-09-29 DIAGNOSIS — M6281 Muscle weakness (generalized): Secondary | ICD-10-CM | POA: Diagnosis not present

## 2023-09-29 DIAGNOSIS — M7541 Impingement syndrome of right shoulder: Secondary | ICD-10-CM | POA: Diagnosis not present

## 2023-10-12 DIAGNOSIS — G8929 Other chronic pain: Secondary | ICD-10-CM | POA: Diagnosis not present

## 2023-10-12 DIAGNOSIS — M6281 Muscle weakness (generalized): Secondary | ICD-10-CM | POA: Diagnosis not present

## 2023-10-12 DIAGNOSIS — M25511 Pain in right shoulder: Secondary | ICD-10-CM | POA: Diagnosis not present

## 2023-10-12 DIAGNOSIS — M7541 Impingement syndrome of right shoulder: Secondary | ICD-10-CM | POA: Diagnosis not present

## 2023-10-16 DIAGNOSIS — F4323 Adjustment disorder with mixed anxiety and depressed mood: Secondary | ICD-10-CM | POA: Diagnosis not present
